# Patient Record
Sex: Female | Born: 1995 | Race: Black or African American | Hispanic: No | Marital: Single | Smoking: Never smoker
Health system: Southern US, Community
[De-identification: ages and names within clinical notes are randomized; demographics above are authoritative.]

## PROBLEM LIST (undated history)

## (undated) DIAGNOSIS — R51 Headache: Secondary | ICD-10-CM

## (undated) DIAGNOSIS — K5900 Constipation, unspecified: Secondary | ICD-10-CM

## (undated) DIAGNOSIS — N200 Calculus of kidney: Secondary | ICD-10-CM

## (undated) DIAGNOSIS — N289 Disorder of kidney and ureter, unspecified: Secondary | ICD-10-CM

## (undated) DIAGNOSIS — K602 Anal fissure, unspecified: Secondary | ICD-10-CM

## (undated) DIAGNOSIS — A749 Chlamydial infection, unspecified: Secondary | ICD-10-CM

## (undated) DIAGNOSIS — N390 Urinary tract infection, site not specified: Secondary | ICD-10-CM

## (undated) HISTORY — DX: Urinary tract infection, site not specified: N39.0

## (undated) HISTORY — DX: Calculus of kidney: N20.0

## (undated) HISTORY — DX: Chlamydial infection, unspecified: A74.9

## (undated) HISTORY — DX: Headache: R51

---
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
## 2017-01-10 ENCOUNTER — Ambulatory Visit (INDEPENDENT_AMBULATORY_CARE_PROVIDER_SITE_OTHER): Payer: Medicaid Other | Admitting: Pediatrics

## 2017-01-10 VITALS — Temp 97.7°F | Wt 133.2 lb

## 2017-01-10 DIAGNOSIS — R300 Dysuria: Secondary | ICD-10-CM | POA: Diagnosis not present

## 2017-01-10 DIAGNOSIS — N898 Other specified noninflammatory disorders of vagina: Secondary | ICD-10-CM

## 2017-01-10 DIAGNOSIS — Z8742 Personal history of other diseases of the female genital tract: Secondary | ICD-10-CM

## 2017-01-10 DIAGNOSIS — Z3202 Encounter for pregnancy test, result negative: Secondary | ICD-10-CM

## 2017-01-10 LAB — POCT URINALYSIS DIPSTICK
BILIRUBIN UA: NEGATIVE
Blood, UA: NEGATIVE
Glucose, UA: NORMAL
KETONES UA: NORMAL
Leukocytes, UA: NEGATIVE
NITRITE UA: NEGATIVE
PH UA: 6 (ref 5.0–8.0)
Spec Grav, UA: 1.025 (ref 1.010–1.025)
UROBILINOGEN UA: 1 U/dL

## 2017-01-10 LAB — POCT URINE PREGNANCY: Preg Test, Ur: NEGATIVE

## 2017-01-10 NOTE — Patient Instructions (Signed)
[facility] features an exceptional balance of expertise and compassion for patients in different life stages. Make an Appointment Call [phone] to make an appointment

## 2017-01-10 NOTE — Progress Notes (Addendum)
Subjective:    Felicia Larsen, is a 21 y.o. female   Chief Complaint  Patient presents with  . Dysuria    2 WEEKS  . Vaginal Discharge    Since 4 months   . Abdominal Cramping    when period comes on    History provider by patient  Contact [phone]   HPI:  CMA's notes and vital signs have been reviewed  New Concern #1 Onset of symptoms:   Dysuria x 2 weeks Urinary frequency No fever No back pain No blood seen in toilet  Hydrates poorly during the day as she works at [employer] and reports little time due to being busy.    Personal hygiene using soap and water regularly to clean.  Very consious about smell  Problem #2 Vaginal discharge on and off over the  Last x 4 months since off depot shot Cottage cheese consistency Vaginal irritation and itchiness Strong odor (not fishy) No recent antibiotics  She is also concerned about BV.  Sexually active in a monogomous relationship. Vaginal penetration only (denies oral/anal route.    Problem #3 Abdominal cramping with menses Bleeding heavy. Previously on Depot shot Currently no contraception, counseled   Medications: None daily Tylenol prn for headaches  Review of Systems  Greater than 10 systems reviewed and all negative except for pertinent positives as noted  Patient's history was reviewed and updated as appropriate: allergies, medications, and problem list.  Patient Active Problem List   Diagnosis Date Noted  . 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 Microscopic hematuria 03/18/2014  . Raynauds syndrome 02/16/2014  . Vaginal discharge 12/20/2013  . Slow transit constipation 10/06/2013  . Sleep disturbance 10/09/2012  . Allergic rhinitis 10/09/2012  . Migraine without aura 07/14/2012       Objective:     Temp 97.7 F (36.5 C) (Temporal)   Wt 133 lb 3.2 oz (60.4 kg)   BMI 21.50 kg/m   Physical Exam  Constitutional: She appears well-developed and well-nourished.    Well appearing  HENT:  Head: Normocephalic.  Nose: Nose normal.  Mouth/Throat: No oropharyngeal exudate.  Eyes: Conjunctivae are normal.  Neck: Normal range of motion. Neck supple.  Cardiovascular: Normal rate, regular rhythm and normal heart sounds.  No murmur heard. Pulmonary/Chest: Effort normal and breath sounds normal. No respiratory distress.  Abdominal: Soft. She exhibits no mass. There is tenderness. There is no guarding.  Denies CVAT  Suprapubic tenderness  Genitourinary: Vaginal discharge found.  Genitourinary Comments: Normal appearing vaginal discharge - milky white No cottage cheese consistency.  No vaginal introitus irritation.  No lesions noted on labia and vaginal introitus  Lymphadenopathy:    She has no cervical adenopathy.  Neurological: She is alert.  Skin: Skin is warm and dry. No rash noted.  Psychiatric: She has a normal mood and affect. Her behavior is normal.  Nursing note and vitals reviewed. Uvula is midline       Assessment & Plan:  1. Dysuria Discussed diagnosis and treatment plan with parent including medication action, dosing and side effects - POCT urinalysis dipstick - negative , unlikely urinary tract infection. Will send for urine culture but low index of suspicion.  Strongly encouraged to hydrate better throughout the day, urinate every 2-3 hours and after intercourse and personal hygiene care to be modified based on discussion today.  2. Vaginal discharge - after discussion with patient and exam today, she declined need for vaginal antifungal treatment as no cottage cheese  type discharge noted.   Discussed lab results with her that were available today and will call with results Contact [phone] when available  - C. trachomatis/N. gonorrhoeae RNA - WET PREP BY MOLECULAR PROBE - POCT urine pregnancy - negative  3. H/O dysmenorrhea She is currently using motrin, heating pad and tea which she reports relieves her pain. Discussed  transitioning care to Adult GYN practice to help patient with ongoing care needs.  Provided information about [facility] team and contact information to help arrange for transition of care.  Medical decision-making:  > 25 minutes spent, more than 50% of appointment was spent discussing diagnosis and management of symptoms  Parent verbalizes understanding and motivation to comply with instructions.  Follow up:  None planned.  Pixie CasinoLaura Stryffeler MSN, CPNP, CDE  Addendum 01/13/17: Review of urine culture - negative and  STI labs - negative Notified patient by phone call. Pixie CasinoLaura Stryffeler MSN, CPNP, CDE

## 2017-01-11 LAB — WET PREP BY MOLECULAR PROBE
CANDIDA SPECIES: NOT DETECTED
MICRO NUMBER:: 81408436
SPECIMEN QUALITY:: ADEQUATE
Trichomonas vaginosis: NOT DETECTED

## 2017-01-11 LAB — C. TRACHOMATIS/N. GONORRHOEAE RNA
C. TRACHOMATIS RNA, TMA: NOT DETECTED
N. gonorrhoeae RNA, TMA: NOT DETECTED

## 2017-01-11 LAB — URINE CULTURE
MICRO NUMBER: 81408437
Result:: NO GROWTH
SPECIMEN QUALITY:: ADEQUATE

## 2017-02-18 ENCOUNTER — Encounter: Payer: Self-pay | Admitting: Pediatrics

## 2017-02-18 ENCOUNTER — Ambulatory Visit (INDEPENDENT_AMBULATORY_CARE_PROVIDER_SITE_OTHER): Payer: Medicaid Other | Admitting: Pediatrics

## 2017-02-18 VITALS — Temp 97.8°F | Wt 129.2 lb

## 2017-02-18 DIAGNOSIS — T148XXA Other injury of unspecified body region, initial encounter: Secondary | ICD-10-CM | POA: Insufficient documentation

## 2017-02-18 DIAGNOSIS — Z113 Encounter for screening for infections with a predominantly sexual mode of transmission: Secondary | ICD-10-CM

## 2017-02-18 DIAGNOSIS — Z3202 Encounter for pregnancy test, result negative: Secondary | ICD-10-CM | POA: Diagnosis not present

## 2017-02-18 DIAGNOSIS — S60452A Superficial foreign body of right middle finger, initial encounter: Secondary | ICD-10-CM

## 2017-02-18 LAB — POCT URINE PREGNANCY: Preg Test, Ur: NEGATIVE

## 2017-02-18 NOTE — Progress Notes (Signed)
Subjective:    Felicia Larsen, is a 22 y.o. female   Chief Complaint  Patient presents with  . finger concern    3 months she said glass is in her finger   History provider by patient  HPI:  CMA's notes and vital signs have been reviewed  New Concern #1 Onset of symptoms:  She believes she could have a piece of glass in finger x 3 months and it hurts when she writes. She is right handed and finds the bump on my right 3rd digit tender and getting frequent irritation from her work with her hands.   No swelling of DIP, no draining.  No Fever. Not taking anything for the pain/discomfort. She works in [employer] at [employer].  New problems #2 Patient [facility] to [facility] and chlamydia and pregnancy. Monogomous partner She is not on contraception except for condoms and they do not use them consistently.   She is trying to get in with her mother's doctor but "I have not found the right person yet and I just love coming here." Voiding  No pain with urination No vaginal discharge "Just want to be safe" Screening completed in December 2018 during office visit for dysuria which was negative for pregnancy, GC , Trichomonas but positive for gardnerella vaginalis.  Medications:  None  Review of Systems  Greater than 10 systems reviewed and all negative except for pertinent positives as noted  Patient's history was reviewed and updated as appropriate: allergies, medications, and problem list.   Patient Active Problem List   Diagnosis Date Noted  . Splinter in skin 02/18/2017  . 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 Microscopic hematuria 03/18/2014  . Raynauds syndrome 02/16/2014  . Vaginal discharge 12/20/2013  . Slow transit constipation 10/06/2013  . Sleep disturbance 10/09/2012  . Allergic rhinitis 10/09/2012  . Migraine without aura 07/14/2012       Objective:     Temp 97.8 F (36.6 C) (Temporal)   Wt 129 lb 3.2 oz (58.6 kg)   BMI  20.85 kg/m   Physical Exam  Constitutional: She appears well-developed.  HENT:  Head: Normocephalic.  Eyes: Conjunctivae are normal.  Cardiovascular: Normal rate, regular rhythm and normal heart sounds.  Musculoskeletal:  Small ~ 2 mm raise fleshtone papule on end of 3rd DIP Painful with pressure.  No evidence of infection.  Neurological: She is alert.  Skin: Skin is warm and dry. No rash noted.  Psychiatric: She has a normal mood and affect. Her behavior is normal.  Nursing note and vitals reviewed.        Assessment & Plan:  1. Screening examination for venereal disease Patient requesting screening to make sure she is "healthy and not pregnant since she is not on birth control and there is not consistent use of condoms.  - C. trachomatis/N. gonorrhoeae RNA - POCT urine pregnancy - negative - discussed result with patient.  Patient to be called with positive results only @ [phone].  2. Splinter in skin Counseled and patient is agreeable to I & D of right 3rd finger with sterile needle. Prepared skin  By cleaning tip of 3rd finger with alcohol x2  and using sterile 20 gauge needle to open skin and used hemostats to remove ?glass splinter.  No bleeding.  Tolerated procedure well.  Patient does not report any pain/tenderness on 3rd DIP. Cleaned and dressed with topical antibiotic. Supportive care and return precautions reviewed.  Again spoke to  patient about identifying and adult physician such as an OB/GYN given need to contraception management for her to see in the future.  Patient reports she is very comfortable at the office and so it have been difficult for her to plan to move her care to and adult office.  Follow up:  None planned.  Pixie Casino MSN, CPNP, CDE

## 2017-02-18 NOTE — Patient Instructions (Signed)
Soak finger in warm water 2 times daily  Monitor for any redness, drainage or signs of infection.  [facility] or OB/GYN doctors, please locate to continue your self care.  Will call if positive labwork. Pixie CasinoLaura Patton Swisher MSN, CPNP, CDE

## 2017-02-19 LAB — C. TRACHOMATIS/N. GONORRHOEAE RNA
C. TRACHOMATIS RNA, TMA: NOT DETECTED
N. gonorrhoeae RNA, TMA: NOT DETECTED

## 2017-03-18 ENCOUNTER — Encounter: Payer: Self-pay | Admitting: Pediatrics

## 2017-03-18 ENCOUNTER — Ambulatory Visit (INDEPENDENT_AMBULATORY_CARE_PROVIDER_SITE_OTHER): Payer: Medicaid Other | Admitting: Pediatrics

## 2017-03-18 ENCOUNTER — Ambulatory Visit
Admission: RE | Admit: 2017-03-18 | Discharge: 2017-03-18 | Disposition: A | Discharge status: Home / Self Care | Payer: Medicaid Other | Source: Ambulatory Visit | Attending: Pediatrics | Admitting: Pediatrics

## 2017-03-18 VITALS — Temp 97.5°F | Wt 125.1 lb

## 2017-03-18 DIAGNOSIS — Z3202 Encounter for pregnancy test, result negative: Secondary | ICD-10-CM | POA: Diagnosis not present

## 2017-03-18 DIAGNOSIS — T148XXA Other injury of unspecified body region, initial encounter: Secondary | ICD-10-CM

## 2017-03-18 DIAGNOSIS — N898 Other specified noninflammatory disorders of vagina: Secondary | ICD-10-CM

## 2017-03-18 LAB — POCT RAPID HIV: RAPID HIV, POC: NEGATIVE

## 2017-03-18 LAB — POCT URINE PREGNANCY: Preg Test, Ur: NEGATIVE

## 2017-03-18 NOTE — Patient Instructions (Signed)
-   Will call with abnormal results

## 2017-03-18 NOTE — Progress Notes (Signed)
   Subjective:     Felicia Larsen N Apgar, is a 22 y.o. female  Personal phone number: [phone]   History provider by patient No interpreter necessary.  Chief Complaint  Patient presents with  . glass in finger    Pt feels it her finger.   . Vaginal Discharge    x2 days, brownish discharge, foul odor.      HPI: Felicia Larsen is a 22 yo F who presents with "glass in her finger" x 4 months and vaginal discharge x 2-3 days.   Patient reports that she still has glass stuck in her finger. It has been there for 4 months now.  Patient was seen on 02/18/17 due to glass in finger (tip of 3rd digit). An I& D was performed and was tolerated well. Patient reports still have pain in the same finger. The pain is intermittent and mostly occurs when she is writing.   Vaginal discharge started 2-3 days go. The discharge is a yellowish color and has a foul odor. Last sexual encounter was 2-3 days ago. She is not on birth control. Her partner did not use condoms.  No fevers. No abdominal pain. No pain with urination.  LMP was last month (pt not sure which day it was)  Review of Systems  As per HPI  Patient's history was reviewed and updated as appropriate: allergies, current medications, past family history, past medical history, past social history, past surgical history and problem list.     Objective:     Temp (!) 97.5 F (36.4 C) (Temporal)   Wt 125 lb 2 oz (56.8 kg)   BMI 20.20 kg/m   Physical Exam GEN: Well-appearing, NAD HEENT:  Sclera clear. Oropharynx non erythematous without lesions or exudates. Moist mucous membranes.  SKIN: No rashes or jaundice.  PULM:  Unlabored respirations.  Clear to auscultation bilaterally with no wheezes or crackles.  No accessory muscle use. CARDIO:  Regular rate and rhythm.  No murmurs.  2+ radial pulses GI:  Soft, non tender, non distended.  GU: Normal external genitalia. No discharge or lesions appreciated.  EXT: Warm and well perfused.  There is a ~ 2  mm flesh colored papule. Non-tender, no erythema. No signs of infection.       Assessment & Plan:   Felicia Larsen is a 22 yo F who presents with "glass in her finger" x 4 months and vaginal discharge x 2-3 days. Given that patient has already had an I&D, will order an X-ray of the finger and, if there is a foreign objected, will refer to adult surgery. Patient his no history of abdominal pain or fever, therefore PID is less likely. Therefore, a pelvic exam was not completed. Her external genital exam was unremarkable with no discharge appreciated. Will order a urine GC/Chlamydia, wet prep, POC HIV, and urine pregnancy test. Will call patient with abnormal results and treatment information if necessary. Patient is trying to find an adult provider to transition to in the community. She reports that mom has been calling around to find a provider.   1. Vaginal discharge - C. trachomatis/N. gonorrhoeae RNA - POCT Rapid HIV - WET PREP BY MOLECULAR PROBE - POCT urine pregnancy  2. Splinter in skin - DG Finger Middle Right; Future   Return if symptoms worsen or fail to improve.  Hollice Gongarshree Carzell Saldivar, MD

## 2017-03-19 LAB — WET PREP BY MOLECULAR PROBE
Candida species: NOT DETECTED
Gardnerella vaginalis: NOT DETECTED
MICRO NUMBER:: 90218413
SPECIMEN QUALITY: ADEQUATE
TRICHOMONAS VAG: NOT DETECTED

## 2017-03-19 LAB — C. TRACHOMATIS/N. GONORRHOEAE RNA
C. trachomatis RNA, TMA: NOT DETECTED
N. gonorrhoeae RNA, TMA: NOT DETECTED

## 2017-03-20 ENCOUNTER — Telehealth: Payer: Self-pay | Admitting: Pediatrics

## 2017-04-23 ENCOUNTER — Encounter: Payer: Self-pay | Admitting: Pediatrics

## 2017-04-23 ENCOUNTER — Ambulatory Visit (INDEPENDENT_AMBULATORY_CARE_PROVIDER_SITE_OTHER): Payer: Medicaid Other | Admitting: Pediatrics

## 2017-04-23 VITALS — BP 108/62 | HR 85 | Ht 65.45 in | Wt 124.2 lb

## 2017-04-23 DIAGNOSIS — Z3202 Encounter for pregnancy test, result negative: Secondary | ICD-10-CM

## 2017-04-23 DIAGNOSIS — Z3049 Encounter for surveillance of other contraceptives: Secondary | ICD-10-CM | POA: Diagnosis not present

## 2017-04-23 DIAGNOSIS — Z113 Encounter for screening for infections with a predominantly sexual mode of transmission: Secondary | ICD-10-CM

## 2017-04-23 DIAGNOSIS — Z30013 Encounter for initial prescription of injectable contraceptive: Secondary | ICD-10-CM | POA: Diagnosis not present

## 2017-04-23 DIAGNOSIS — G43009 Migraine without aura, not intractable, without status migrainosus: Secondary | ICD-10-CM

## 2017-04-23 LAB — POCT URINE PREGNANCY: PREG TEST UR: NEGATIVE

## 2017-04-23 MED ORDER — MEDROXYPROGESTERONE ACETATE 150 MG/ML IM SUSP
150.0000 mg | Freq: Once | INTRAMUSCULAR | Status: AC
  Administered 2017-04-23: 150 mg via INTRAMUSCULAR

## 2017-04-23 NOTE — Patient Instructions (Signed)
Contraceptive Injection Information A contraceptive injection is a shot that prevents pregnancy. It is effective for 3 months. How does the injection work? For this injection, the medicine progestin is injected into your body. The medicine is injected under the skin or in the muscle, usually in the upper arm or the buttock. Progestin has similar effects to the hormone progesterone, which is involved with the menstrual cycle and pregnancy. The progestin injection prevents pregnancy by:  Stopping the ovaries from releasing eggs.  Thickening cervical mucus to prevent sperm from entering the cervix.  Thinning the lining of the uterus to prevent a fertilized egg from attaching to the uterus.  What are the advantages of this form of birth control? The following are some advantages of this form of birth control:  It is highly effective at preventing pregnancy when used correctly.  It can slow down the flow of heavy menstrual periods.  It can control cramps and painful menstrual periods.  It may temporarily stop your menstrual periods (amenorrhea).  It lowers your risk for developing cancer of the uterus and pelvic inflammatory disease (PID).  What are the disadvantages of this form of birth control? The following are some disadvantages of this form of birth control:  It can be associated with side effects such as: ? Weight gain. ? Spotting or bleeding between periods. ? Breast tenderness. ? Headaches. ? Abdominal discomfort. ? Nervousness. ? Loss of bone density.  It does not protect against STIs (sexually transmitted infections).  You must visit your health care provider every 3 months (12 weeks) to receive the injection.  The injections may be uncomfortable.  It can take you up to a year to become pregnant after you stop the injections.  Am I a good candidate for these injections? Your health care provider can help you determine whether you are a good candidate for  contraceptive injections. Make sure to discuss the possible side effects with your health care provider. You should not use this contraceptive if you have a history of:  Breast cancer.  High blood pressure.  Heart attack or stroke.  Liver, heart, or kidney disease.  Liver cancer.  Diabetes.  Unexplained vaginal bleeding.  Rheumatoid arthritis.  Migraines.  Summary  A contraceptive injection is a shot that prevents pregnancy. It is effective for 3 months.  This injection prevents pregnancy by thickening the cervical mucus, thinning the uterine wall, and stopping the ovaries from releasing eggs.  This type of birth control is highly effective at preventing pregnancy. It can also slow down the flow of periods or stop them temporarily.  Side effects of this injection can include weight gain, headaches, bleeding between periods, nervousness, and loss of bone density.  Your health care provider can help you determine whether you are a good candidate for contraceptive injections. This information is not intended to replace advice given to you by your health care provider. Make sure you discuss any questions you have with your health care provider. Document Released: 01/03/2011 Document Revised: 12/04/2015 Document Reviewed: 12/04/2015 Elsevier Interactive Patient Education  2017 Elsevier Inc.  

## 2017-04-23 NOTE — Progress Notes (Signed)
History was provided by the patient.  Felicia Larsen is a 22 y.o. female who is here for contraception.   PCP confirmed? Yes.    Felicia Larsen, Angela J, MD  HPI:  Wants to get back on birth control. Was trying to give her body some time off of it.  Thinks she wants to try pills. Has never been on.  When she has her periods she has some blood clotting but it has improved.  LMP started 3/21. Has fairly significant cramping.  Has migraine with aura. Stars in vision and sometimes blindness in one eye. Feels like she gets them 2-3 times a week. Not waking with headaches, no vomiting during headaches. These same symptoms have been going on for years. No disturbances to balance or loss of consciousness.  Denies vaginal discharge or pain with sex.  Sexually active last week but used condom.   Review of Systems  Constitutional: Negative for malaise/fatigue.  Eyes: Negative for double vision.  Respiratory: Negative for shortness of breath.   Cardiovascular: Negative for chest pain and palpitations.  Gastrointestinal: Negative for abdominal pain, constipation, diarrhea, nausea and vomiting.  Genitourinary: Negative for dysuria.  Musculoskeletal: Negative for joint pain and myalgias.  Skin: Negative for rash.  Neurological: Positive for headaches. Negative for dizziness.  Endo/Heme/Allergies: Does not bruise/bleed easily.     Patient Active Problem List   Diagnosis Date Noted  . Splinter in skin 02/18/2017  . 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 Microscopic hematuria 03/18/2014  . Raynauds syndrome 02/16/2014  . Vaginal discharge 12/20/2013  . Slow transit constipation 10/06/2013  . Sleep disturbance 10/09/2012  . Allergic rhinitis 10/09/2012  . Migraine without aura 07/14/2012    No current outpatient medications on file prior to visit.   No current facility-administered medications on file prior to visit.     Allergies  Allergen Reactions  . Latex  Itching and Rash    IRRITATION    Physical Exam:    Vitals:   04/23/17 1449  BP: 108/62  Pulse: 85  Weight: 124 lb 3.2 oz (56.3 kg)  Height: 5' 5.45" (1.662 m)    Blood pressure percentiles are not available for patients who are 18 years or older. No LMP recorded.  Physical Exam  Constitutional: She appears well-developed. No distress.  HENT:  Mouth/Throat: Oropharynx is clear and moist.  Neck: No thyromegaly present.  Cardiovascular: Normal rate and regular rhythm.  No murmur heard. Pulmonary/Chest: Breath sounds normal.  Abdominal: Soft. She exhibits no mass. There is no tenderness. There is no guarding.  Musculoskeletal: She exhibits no edema.  Lymphadenopathy:    She has no cervical adenopathy.  Neurological: She is alert.  Skin: Skin is warm. No rash noted.  Psychiatric: She has a normal mood and affect.  Nursing note and vitals reviewed.    Assessment/Plan: 1. Encounter for initial prescription of injectable contraceptive Wishes to get back on depo vs one of the other longer acting methods. Plans nexplanon if she is not happy with depo. Discussed risks with estrogen given migraine with aura and she expressed understanding.  - medroxyPROGESTERone (DEPO-PROVERA) injection 150 mg  2. Migraine without aura and without status migrainosus, not intractable Getting frequent migraines- referred to neuro.  - Ambulatory referral to Neurology  3. Routine screening for STI (sexually transmitted infection) Per protocol.  - C. trachomatis/N. gonorrhoeae RNA  4. Pregnancy examination or test, negative result Per protocol.  - POCT urine pregnancy

## 2017-04-24 LAB — C. TRACHOMATIS/N. GONORRHOEAE RNA
C. trachomatis RNA, TMA: NOT DETECTED
N. gonorrhoeae RNA, TMA: NOT DETECTED

## 2017-05-01 ENCOUNTER — Encounter: Payer: Self-pay | Admitting: Pediatrics

## 2017-05-01 ENCOUNTER — Ambulatory Visit (INDEPENDENT_AMBULATORY_CARE_PROVIDER_SITE_OTHER): Payer: Medicaid Other | Admitting: Pediatrics

## 2017-05-01 VITALS — BP 98/60 | Wt 126.2 lb

## 2017-05-01 DIAGNOSIS — R350 Frequency of micturition: Secondary | ICD-10-CM

## 2017-05-01 DIAGNOSIS — L732 Hidradenitis suppurativa: Secondary | ICD-10-CM

## 2017-05-01 LAB — POCT URINALYSIS DIPSTICK
GLUCOSE UA: NORMAL
LEUKOCYTES UA: NEGATIVE
Nitrite, UA: NEGATIVE
RBC UA: NEGATIVE
Spec Grav, UA: 1.025 (ref 1.010–1.025)
Urobilinogen, UA: NEGATIVE E.U./dL — AB
pH, UA: 6 (ref 5.0–8.0)

## 2017-05-01 MED ORDER — CLINDAMYCIN PHOSPHATE 1 % EX GEL: Freq: Every day | CUTANEOUS | 1 refills | Status: DC

## 2017-05-01 NOTE — Patient Instructions (Signed)
Please call if you have any problem getting, or using the medicine(s) prescribed today. Use the medicine as we talked about and as the label directs.  Please be sure to use protection until your next appointment.   Call if you have any genital symptoms before your next appointment.

## 2017-05-01 NOTE — Progress Notes (Signed)
    Assessment and Plan:     1. Urinary frequency No sign of infection.  Will not send culture - POCT urinalysis dipstick  2. Hidradenitis axillaris - clindamycin (CLINDAGEL) 1 % gel; Apply topically daily. Apply small amount to clean dry skin.  Dispense: 30 g; Refill: 1  Other concerns due to unprotected sex cannot be addressed properly for a couple more week.   More than 25 minutes reviewing symptoms, risks, possible diagnoses, preventive measures, and counsel on safe sex.   Intense concern about cost of meds.  Needs return appt.   Return in about 3 weeks (around 05/22/2017) for medication response follow up with Dr Lubertha SouthProse.    Subjective:  HPI Felicia Larsen is a 22 y.o. old female here with herself  Chief Complaint  Patient presents with  . Recurrent Skin Infections    under right arm draining.   Worst episode ever Had a few other spots previously but they resolved quickly Using some alcohol wipes to keep cleaning, as pus keeps draining  Got Depo shot last Thursday Had unprotected sex the next day.  Long time partner. Previously on Depo and had no periods.    Associated signs/symptoms: no Medications/treatments tried at home: above  Fever: nono Change in appetite: no Change in sleep: no Change in breathing: no Vomiting/diarrhea/stool change: no Change in urine: yes, very frequent urination.  No urgency.  No change in color. No burning Change in skin: yes  Immunizations, problem list, medications and allergies were reviewed and updated.   Review of Systems Above   History and Problem List: Felicia Larsen has Migraine without aura; Sleep disturbance; Allergic rhinitis; Slow transit constipation; Vaginal discharge; Raynauds syndrome; Microscopic hematuria; Urethral prolapse; Latex allergy; and Splinter in skin on their problem list.  Felicia Larsen  has a past medical history of Anal fissure, Chlamydia (12/22/2013), Constipation, Headache(784.0), Renal disorder, and Urinary tract  infection.  Objective:   BP 98/60   Wt 126 lb 3.2 oz (57.2 kg)   BMI 20.71 kg/m  Physical Exam  Constitutional: She is oriented to person, place, and time. She appears well-developed and well-nourished.  HENT:  Right Ear: External ear normal.  Left Ear: External ear normal.  Nose: Nose normal.  Mouth/Throat: Oropharynx is clear and moist.  Eyes: Conjunctivae and EOM are normal.  Neck: Neck supple. No thyromegaly present.  Cardiovascular: Normal rate, regular rhythm and normal heart sounds.  Pulmonary/Chest: Effort normal and breath sounds normal.  Abdominal: Soft. Bowel sounds are normal. There is no tenderness.  Neurological: She is alert and oriented to person, place, and time.  Skin: Skin is warm and dry. No rash noted.  Right axilla - 2 cm firm nodule; small amount of pus expressed; very tender   Nursing note and vitals reviewed.   Tilman Neatlaudia C Cameo Schmiesing MD MPH 05/01/2017 4:45 PM

## 2017-05-22 ENCOUNTER — Ambulatory Visit: Payer: Medicaid Other | Admitting: Pediatrics

## 2017-05-27 ENCOUNTER — Encounter (HOSPITAL_COMMUNITY): Payer: Self-pay | Admitting: Emergency Medicine

## 2017-05-27 ENCOUNTER — Emergency Department (HOSPITAL_COMMUNITY)
Admission: EM | Admit: 2017-05-27 | Discharge: 2017-05-27 | Disposition: A | Discharge status: Home / Self Care | Payer: Medicaid Other | Attending: Emergency Medicine | Admitting: Emergency Medicine

## 2017-05-27 DIAGNOSIS — R600 Localized edema: Secondary | ICD-10-CM | POA: Insufficient documentation

## 2017-05-27 DIAGNOSIS — Z5321 Procedure and treatment not carried out due to patient leaving prior to being seen by health care provider: Secondary | ICD-10-CM | POA: Diagnosis not present

## 2017-05-27 NOTE — ED Triage Notes (Signed)
Patient c/o wart to right middle finger x8 months. Patient also c/o abscess to right axilla x 2 months. Reports it was previously drained at PCP.

## 2017-05-27 NOTE — ED Notes (Signed)
Patient called to be placed in treatment room x3 with no answer.

## 2017-05-27 NOTE — ED Notes (Signed)
Pt not answering from lobby 

## 2017-05-27 NOTE — ED Notes (Signed)
No answer from lobby  

## 2017-06-12 ENCOUNTER — Ambulatory Visit: Payer: Medicaid Other | Admitting: Neurology

## 2017-06-12 ENCOUNTER — Encounter: Payer: Self-pay | Admitting: Neurology

## 2017-06-12 ENCOUNTER — Telehealth: Payer: Self-pay | Admitting: Neurology

## 2017-06-12 ENCOUNTER — Other Ambulatory Visit: Payer: Self-pay

## 2017-06-12 VITALS — BP 111/72 | HR 69 | Ht 66.0 in | Wt 126.5 lb

## 2017-06-12 DIAGNOSIS — G43009 Migraine without aura, not intractable, without status migrainosus: Secondary | ICD-10-CM

## 2017-06-12 DIAGNOSIS — N289 Disorder of kidney and ureter, unspecified: Secondary | ICD-10-CM | POA: Diagnosis not present

## 2017-06-12 MED ORDER — FOLIC ACID 1 MG PO TABS: 1.0000 mg | ORAL_TABLET | Freq: Every day | ORAL | 3 refills | Status: DC

## 2017-06-12 MED ORDER — RIZATRIPTAN BENZOATE 10 MG PO TBDP: 10.0000 mg | ORAL_TABLET | Freq: Three times a day (TID) | ORAL | 3 refills | Status: DC | PRN

## 2017-06-12 MED ORDER — TOPIRAMATE 25 MG PO TABS: ORAL_TABLET | ORAL | 3 refills | Status: DC

## 2017-06-12 NOTE — Patient Instructions (Signed)
   We will start Topamax for the headache to take daily to prevent the headache.  Take Maxalt if needed for the headache.  Folic acid 1 mg tablet to take while on Topamax, this is a vitamin.  Topamax (topiramate) is a seizure medication that has an FDA approval for seizures and for migraine headache. Potential side effects of this medication include weight loss, cognitive slowing, tingling in the fingers and toes, and carbonated drinks will taste bad. If any significant side effects are noted on this drug, please contact our office.

## 2017-06-12 NOTE — Progress Notes (Signed)
Reason for visit: Migraine headache  Referring Bonnita Nasutingelia N Amirault is a 22 y.o. female  History of present illness:  Ms. Fackler is a 22 year old right-handed black female with a history of migraine headaches since she was around 22 years old.  The headaches were very severe at that time, and have actually lessened in frequency and severity more recently.  The patient however is still having 5 or 6 headaches a month, she is missing work at times, she missed 3 days last month.  The patient indicates that the headaches are often times around the eyes, when the headaches are severe they may be unilateral on the frontal regions associated with photophobia, phonophobia, and some nausea.  The patient may have sparkly lights in the vision prior to onset of the headache.  She denies any numbness or weakness of the extremities.  She denies confusion with the headache.  She has been taking Advil if needed which seems to be effective.  She indicates that odors such as perfumes, bright lights, and spicy foods will bring on headache.  If she does not get enough sleep this will also cause problems.  The patient indicates that a paternal grandfather also has migraine.  The patient comes into the office today for an evaluation.  She had a CT scan of the brain done in 2007 because of headache which was unremarkable.  Past Medical History:  Diagnosis Date  . Anal fissure   . Chlamydia 12/22/2013  . Constipation   . Headache(784.0)   . Renal calculi   . Renal disorder   . Urinary tract infection    E.Coli resistant to Bactrim    History reviewed. No pertinent surgical history.  Family History  Problem Relation Age of Onset  . Kidney Stones Mother   . GI problems Mother   . Hypertension Mother   . Migraines Maternal Grandmother   . Heart disease Maternal Grandmother   . Stroke Maternal Grandmother   . Asthma Sister   . Asthma Brother   . Diabetes Paternal Grandmother   .  Migraines Paternal Grandfather   . Asthma Brother   . Allergic rhinitis Brother   . Migraines Maternal Aunt   . Migraines Maternal Uncle     Social history:  reports that she has never smoked. She has never used smokeless tobacco. She reports that she does not drink alcohol or use drugs.  Medications:  Prior to Admission medications   Medication Sig Start Date End Date Taking? Authorizing Provider  acetaminophen (TYLENOL) 325 MG tablet Take 650 mg by mouth every 6 (six) hours as needed.   Yes [provider]  ibuprofen (ADVIL,MOTRIN) 200 MG tablet Take 200 mg by mouth every 6 (six) hours as needed.   Yes [provider]  folic acid (FOLVITE) 1 MG tablet Take 1 tablet (1 mg total) by mouth daily. 06/12/17   York Spaniel, MD  rizatriptan (MAXALT-MLT) 10 MG disintegrating tablet Take 1 tablet (10 mg total) by mouth 3 (three) times daily as needed for migraine. May repeat in 2 hours if needed 06/12/17   York Spaniel, MD  topiramate (TOPAMAX) 25 MG tablet Take one tablet at night for one week, then take 2 tablets at night 06/12/17   York Spaniel, MD      Allergies  Allergen Reactions  . Latex Itching and Rash    IRRITATION    ROS:  Out of a complete 14 system review of symptoms,  the patient complains only of the following symptoms, and all other reviewed systems are negative.  Blurred vision, double vision, eye pain Constipation Headache, dizziness Not enough sleep allergies   Blood pressure 111/72, pulse 69, height  (1.676 m), weight 126 lb 8 oz (57.4 kg).  Physical Exam  General: The patient is alert and cooperative at the time of the examination.  Eyes: Pupils are equal, round, and reactive to light. Discs are flat bilaterally.  Neck: The neck is supple, no carotid bruits are noted.  Respiratory: The respiratory examination is clear.  Cardiovascular: The cardiovascular examination reveals a regular rate and rhythm, no obvious murmurs or  rubs are noted.  Skin: Extremities are without significant edema.  Neurologic Exam  Mental status: The patient is alert and oriented x 3 at the time of the examination. The patient has apparent normal recent and remote memory, with an apparently normal attention span and concentration ability.  Cranial nerves: Facial symmetry is present. There is good sensation of the face to pinprick and soft touch bilaterally. The strength of the facial muscles and the muscles to head turning and shoulder shrug are normal bilaterally. Speech is well enunciated, no aphasia or dysarthria is noted. Extraocular movements are full. Visual fields are full. The tongue is midline, and the patient has symmetric elevation of the soft palate. No obvious hearing deficits are noted.  Motor: The motor testing reveals 5 over 5 strength of all 4 extremities. Good symmetric motor tone is noted throughout.  Sensory: Sensory testing is intact to pinprick, soft touch, vibration sensation, and position sense on all 4 extremities. No evidence of extinction is noted.  Coordination: Cerebellar testing reveals good finger-nose-finger and heel-to-shin bilaterally.  Gait and station: Gait is normal. Tandem gait is normal. Romberg is negative. No drift is seen.  Reflexes: Deep tendon reflexes are symmetric and normal bilaterally. Toes are downgoing bilaterally.   Assessment/Plan:  1.  Classic migraine headache  The patient has intractable headache, she is having headache frequency enough that it would justify daily prophylactic medication.  The patient will be started on Topamax working up to 50 mg at night, she will be given Maxalt to take if needed.  She will call for any dose adjustments.  She will follow-up in 3 months.  The patient was also given a prescription for folic acid taking 1 mg daily.  Marlan Palau MD 06/12/2017 11:58 AM  [facility] [address] [place], [place]  [address]  Phone [phone] Fax [phone]

## 2017-06-12 NOTE — Telephone Encounter (Signed)
[facility] [phone] needs clarification on script sent for rizatriptan (MAXALT-MLT) 10 MG disintegrating tablet. On the directions. Thank you

## 2017-06-12 NOTE — Telephone Encounter (Signed)
I tried to call pharmacy, unable to get through, I resubmitted the prescription for Maxalt, made the instructions more clear.

## 2017-07-09 ENCOUNTER — Ambulatory Visit: Payer: Medicaid Other

## 2017-07-11 ENCOUNTER — Other Ambulatory Visit: Payer: Self-pay

## 2017-07-11 ENCOUNTER — Ambulatory Visit (INDEPENDENT_AMBULATORY_CARE_PROVIDER_SITE_OTHER): Payer: Medicaid Other | Admitting: Pediatrics

## 2017-07-11 ENCOUNTER — Encounter: Payer: Self-pay | Admitting: Pediatrics

## 2017-07-11 VITALS — Wt 127.6 lb

## 2017-07-11 DIAGNOSIS — L732 Hidradenitis suppurativa: Secondary | ICD-10-CM | POA: Diagnosis not present

## 2017-07-11 DIAGNOSIS — B079 Viral wart, unspecified: Secondary | ICD-10-CM | POA: Diagnosis not present

## 2017-07-11 MED ORDER — SALICYLIC ACID 17 % EX GEL: CUTANEOUS | 0 refills | Status: DC

## 2017-07-11 NOTE — Patient Instructions (Addendum)
   Apply to wart on finger and cover with duct tape; change every 2 days. May take up to 3 months to resolve. If not effective please let us know.  For the lesion at your underarm area: Warm showers and application of warm compress when able. Avoid tight fitting garments that may rub at area and forgo shaving for now. Choose a mild deordorant like Dove or plain Secret  Please check with your mother and decide your preference for adult medical services. [facility] is often a good choice for our patients moving out of pediatric medical care into adult care. We can continue to see you for medical care until you decide on adult practice.

## 2017-07-11 NOTE — Progress Notes (Signed)
   Subjective:    Patient ID: Felicia Larsen, female    DOB: 11/14/1995, 22 y.o.   MRN: [id number]  HPI Felicia Larsen is here with concern of wart on finger for about 6 months and continued lesion under right arm. She Larsen she purchased OTC product for the wart and used for about one month.  Admits it got smaller with treatment but product in bottle dried up and she decided to come to office for advice; also Larsen she may have used incorrectly, applied on and around lesion, resulting in skin irritation.   Has some discomfort due to wart.  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   Only one digit involved.  No other modifying factors.  Other concern is lesion in right axilla.  She was seen in office 2 months ago for this and Larsen provider drained lesion. Felicia Larsen she feels this made it worse and describes developing a cord in her upper arm.  She was prescribed clindamycin gel but Larsen she did not get it because her insurance did not cover it and she could not afford the medication.  She uses Darene LamerNair for hair removal and uses EchoStarDove deodorant. Has pain but no redness, increased warmth or drainage; no fever.  Cord is resolved. No other medication or modifying factors.  She Larsen she is otherwise well. PMH, problem list, medications and allergies, family and social history reviewed and updated as indicated. Personal phone:  [phone]; ca;; before noon due to inability to accept calls while at work.  Review of Systems As noted above.    Objective:   Physical Exam  Constitutional: She appears well-developed and well-nourished. No distress.  Cardiovascular: Normal rate, regular rhythm and normal heart sounds.  No murmur heard. Pulmonary/Chest: Effort normal and breath sounds normal. No respiratory distress.  Skin: Skin is warm and dry.  Right 3rd finger has a 1-2 mm hyperpigment wart that is mildly raised.  No surrounding redness or other abnormal finding, Right  axilla has an approximate 8 mm induration without fluctuance, warmth or redness. No palpable extension.  Patient voices discomfort on manipulation.  Nursing note and vitals reviewed.  Weight 127 lb 9.6 oz (57.9 kg).    Assessment & Plan:   1. Viral wart on finger Lesion is tiny and she admits improvement with OTC.  Discussed wart treatment with patient and relative discomfort of treatments. She is willing to try advised treatment from MD of topical SA gel (cost is about $4 per website representation) and duct tape.  She will follow up if not improved or if complications. - salicylic acid 17 % gel; Clean and dry skin then apply gel to wart; cover with duct tape.  Reapply every 48 hours.  May take up to 12 weeks to resolve.  Dispense: 15 g; Refill: 0  2. Hidradenitis axillaris Current finding suggests no residual to drain and area is both highly localized and small.  Also, comparison to description in chart 2 months ago shows much improvement. Discussed personal care to avoid irritation and decrease return rate. Her current insurance plan does not cover antibiotic gel.  Searched GoodRx for patient and discussed best cost of 31.48 and she stated she can afford this price.  Will follow up as needed.  Maree ErieAngela J Erienne Spelman, MD

## 2017-07-12 ENCOUNTER — Encounter: Payer: Self-pay | Admitting: Pediatrics

## 2017-07-12 MED ORDER — CLINDAMYCIN PHOSPHATE 1 % EX GEL: CUTANEOUS | 0 refills | Status: DC

## 2017-07-14 DIAGNOSIS — J020 Streptococcal pharyngitis: Secondary | ICD-10-CM | POA: Diagnosis not present

## 2017-07-29 ENCOUNTER — Ambulatory Visit: Payer: Medicaid Other | Admitting: Student

## 2017-08-07 ENCOUNTER — Ambulatory Visit (INDEPENDENT_AMBULATORY_CARE_PROVIDER_SITE_OTHER): Payer: Medicaid Other | Admitting: Licensed Clinical Social Worker

## 2017-08-07 ENCOUNTER — Encounter: Payer: Self-pay | Admitting: Pediatrics

## 2017-08-07 ENCOUNTER — Ambulatory Visit (INDEPENDENT_AMBULATORY_CARE_PROVIDER_SITE_OTHER): Payer: Medicaid Other | Admitting: Pediatrics

## 2017-08-07 VITALS — Wt 130.1 lb

## 2017-08-07 DIAGNOSIS — F4320 Adjustment disorder, unspecified: Secondary | ICD-10-CM | POA: Diagnosis not present

## 2017-08-07 DIAGNOSIS — N898 Other specified noninflammatory disorders of vagina: Secondary | ICD-10-CM

## 2017-08-07 DIAGNOSIS — L732 Hidradenitis suppurativa: Secondary | ICD-10-CM | POA: Diagnosis not present

## 2017-08-07 DIAGNOSIS — Z7251 High risk heterosexual behavior: Secondary | ICD-10-CM | POA: Diagnosis not present

## 2017-08-07 MED ORDER — ULIPRISTAL ACETATE 30 MG PO TABS
30.0000 mg | ORAL_TABLET | Freq: Once | ORAL | Status: AC
  Administered 2017-08-07: 30 mg via ORAL

## 2017-08-07 NOTE — Progress Notes (Signed)
   Subjective:    Patient ID: Felicia Larsen, female    DOB: May 10, 1995, 22 y.o.   MRN: [id number]  HPI Felicia Larsen is here with multiple concerns. 1.  States interest in "plan b" pill due to unprotected intercourse.  States current female partner of 4 months.  No use of contraception and no use of condom. States last intercourse yesterday and 3 days ago; other prior encounters.  States she was having breast discomfort and vaginal discharge but now has menstruation for Day #3.  Informed staff that cost OTC of plan B was prohibitive, so consulting office for advice. Had hormonal implant but had it removed, then started depo hormonal injection but failed to follow through; last injection was in March.  2.  Complains of recurrent discomfort in axilla.  She has presented to the office for this on 2 previous occassions and was each time prescribed topical clindamycin.  States she never picked up the medication.  Reports pain gets better and worse and has had "cord" palpable in arm before.  Currently using mild deodorant product.  3.  Viral wart:  States nearly resolved with use of salicylic acid and duct tape; no further needs today.  No other medications or modifying factors.  PMH, problem list, medications and allergies, family and social history reviewed and updated as indicated.  Review of Systems As noted in HPI.    Objective:   Physical Exam  Constitutional: She appears well-developed and well-nourished. No distress.  HENT:  Head: Normocephalic.  Neck: Normal range of motion. Neck supple.  Cardiovascular: Normal rate and normal heart sounds.  Pulmonary/Chest: Effort normal. No respiratory distress.  Skin: Skin is warm and dry.  Right axilla with palpable puffy area of about 1 inch but no discrete papule or fluctuance; no redness or drainage; no extension to upper arm  Nursing note and vitals reviewed.   Weight 130 lb 1.6 oz (59 kg).    Assessment & Plan:   1. Hidradenitis  axillaris Nothing to drain today and does not feel warm or fluctuant. Discussed need to follow previous care directions and use the topical clindamycin, antiperspirant free underarm care.  Prescription should still be active at pharmacy, or she can have them contact office for resend. Felicia Larsen voiced understanding.  2. Unprotected sex Counseled on need for personal care to avoid STI and undesired pregnancy.  She met with [facility] during this visit. Will contact with test results in MyChart and treat as indicated; she voiced agreement with plan. She is to keep appointment with Adolescent Medicine. - C. trachomatis/N. gonorrheae RNA - WET PREP BY MOLECULAR PROBE - hCG, quantitative, pregnancy - ulipristal acetate (ELLA) tablet 30 mg  3. Vaginal discharge As discussed above; will contact with results and any treatment. - C. trachomatis/N. gonorrheae RNA - WET PREP BY MOLECULAR PROBE  Lurlean Leyden, MD

## 2017-08-07 NOTE — Patient Instructions (Addendum)
For the underarm lesion:  Warm compress to underarm and avoid tight fitting clothing. Use a mild deodorant like we discussed - nothing that is long duration (N degree) and preferably without the antiperspirant factor.  You may want to check the natural products lines like Tom's. Pick up the clindamycin gel as previously prescribed.  Keep your appt with Adolescent Medicine.    Ulipristal oral tablets What is this medicine? ULIPRISTAL (UE li pris tal) is an emergency contraceptive. It prevents pregnancy if taken within 5 days (120 hours) after your birth control fails or you have unprotected sex. This medicine will not work if you are already pregnant. This medicine may be used for other purposes; ask your health care provider or pharmacist if you have questions. COMMON BRAND NAME(S): ella What should I tell my health care provider before I take this medicine? They need to know if you have any of these conditions: -an unusual or allergic reaction to ulipristal, other medicines, foods, dyes, or preservatives -pregnant or trying to get pregnant -breast-feeding How should I use this medicine? Take this medicine by mouth with or without food. Your doctor may want you to use a quick-response pregnancy test prior to using the tablets. Take your medicine as soon as possible and not more than 5 days (120 hours) after the event. This medicine can be taken at any time during your menstrual cycle. Follow the dose instructions of your health care provider exactly. Contact your health care provider right away if you vomit within 3 hours of taking your medicine to discuss if you need to take another tablet. A patient package insert for the product will be given with each prescription and refill. Read this sheet carefully each time. The sheet may change frequently. Contact your pediatrician regarding the use of this medicine in children. Special care may be needed. Overdosage: If you think you have taken too  much of this medicine contact a poison control center or emergency room at once. NOTE: This medicine is only for you. Do not share this medicine with others. What if I miss a dose? This does not apply; this medicine is not for regular use. What may interact with this medicine? This medicine may interact with the following medications: -birth control pills -bosentan -certain medicines for fungal infections like griseofulvin, itraconazole, and ketoconazole -certain medicines for seizures like barbiturates, carbamazepine, felbamate, oxcarbazepine, phenytoin, topiramate -dabigatran -digoxin -rifampin -St. John's Wort This list may not describe all possible interactions. Give your health care provider a list of all the medicines, herbs, non-prescription drugs, or dietary supplements you use. Also tell them if you smoke, drink alcohol, or use illegal drugs. Some items may interact with your medicine. What should I watch for while using this medicine? Your period may begin a few days earlier or later than expected. If your period is more than 7 days late, pregnancy is possible. See your health care provider as soon as you can and get a pregnancy test. Talk to your healthcare provider before taking this medicine if you know or suspect that you are pregnant. Contact your healthcare provider if you think you may be pregnant and you have taken this medicine. Your healthcare provider may wish to provide information on your pregnancy to help study the safety of this medicine during pregnancy. For information, go to AttractionPlanet.fi. If you have severe abdominal pain about 3 to 5 weeks after taking this medicine, you may have a pregnancy outside the womb, which is called an ectopic or  tubal pregnancy. Call your health care provider or go to the nearest emergency room right away if you think this is happening. Discuss birth control options with your health care provider. Emergency birth control is not to be used  routinely to prevent pregnancy. It should not be used more than once in the same cycle. Birth control pills may not work properly while you are taking this medicine. Wait at least 5 days after taking this medicine to start or continue other hormone based birth control. Be sure to use a reliable barrier contraceptive method (such as a condom with spermicide) between the time you take this medicine and your next period. This medicine does not protect you against HIV infection (AIDS) or any other sexually transmitted diseases (STDs). What side effects may I notice from receiving this medicine? Side effects that you should report to your doctor or health care professional as soon as possible: -allergic reactions like skin rash, itching or hives, swelling of the face, lips, or tongue Side effects that usually do not require medical attention (report to your doctor or health care professional if they continue or are bothersome): -dizziness -headache -nausea -spotting -stomach pain -tiredness This list may not describe all possible side effects. Call your doctor for medical advice about side effects. You may report side effects to FDA at 1-800-FDA-1088. Where should I keep my medicine? Keep out of the reach of children. Store at between 20 and 25 degrees C (68 and 77 degrees F). Protect from light and keep in the blister card inside the original box until you are ready to take it. Throw away any unused medicine after the expiration date. NOTE: This sheet is a summary. It may not cover all possible information. If you have questions about this medicine, talk to your doctor, pharmacist, or health care provider.  2018 Elsevier/Gold Standard (2015-02-16 10:39:30)   Adult Primary Care Options:  Call to enroll!  [facility]  [address], [place], [place] [address] [phone]  [facility] [address] [place], [place], [place] [address] [phone]  [phone]

## 2017-08-07 NOTE — BH Specialist Note (Signed)
Integrated Behavioral Health Initial Visit  MRN: [id number] Name: Felicia GoodellAngelia N Zollner  Number of Integrated Behavioral Health Clinician visits:: 1/6 Session Start time: 2:36 PM Session End time: 2:52PM  Total time: 16 Minutes  Type of Service: Integrated Behavioral Health- Individual/Family Interpretor:No. Interpretor Name and Language: N/A   Warm Hand Off Completed.       SUBJECTIVE: Felicia Goodellngelia N Larsen is a 22 y.o. female  Patient was referred by Dr. Duffy RhodyStanley  for support. Patient reports the following symptoms/concerns:  Patient with hx armpit pain and need for emergency contraception.  Duration of problem: Days to weeks; Severity of problem: moderate  OBJECTIVE: Mood: Euthymic and Affect: Appropriate Risk of harm to self or others: No plan to harm self or others  LIFE CONTEXT: Family and Social: Pt lives with  mom and siblings.   School/Work:Pt has an upcoming  Interview at  [facility] [employer] [facility] [employer].  Pt worked at [employer] previously.  Self-Care: Pt enjoys watching  TV and  Hanging out w boyfriend- Alinda Moneyony often.  Life Changes:  Pt report recent acne break out.  Discontinued contraception recently due to side effects. Recent relationship- dating 4 mo.    GOALS ADDRESSED: Patient will: 1. Identify barriers of social emotional development.  2. Demonstrate ability to: Increase adequate support systems for patient/family    INTERVENTIONS: Interventions utilized: Supportive Counseling and Psychoeducation and/or Health Education  Standardized Assessments completed: [facility] with [facility]  ASSESSMENT: Patient currently experiencing difficulty  setting boundaries with boyfriend surrounding risky sexual behavior (Use of contraception) . Pt with interest in re-exploring Nexplanon and discussing ways to combat side effects at upcoming apt w. [employer].    Processed weighing pros and cons and using assertive language.    Patient may benefit from communicating healthy sexual  boundaries with boyfriend.    Patient may benefit from connecting with adult primary care, brief resources provided.   PLAN: 1. Follow up with behavioral health clinician on : As needed.  2. Behavioral recommendations:  1. Pt will discuss healthy sexual boundaries with boyfriend. 3. Referral(s): Community Resources:  Adult Primary Care options 4. "From scale of 1-10, how likely are you to follow plan?": Pt voice understanding and agreement.   Shiniqua Prudencio BurlyP Harris, LCSWA

## 2017-08-08 LAB — C. TRACHOMATIS/N. GONORRHOEAE RNA
C. trachomatis RNA, TMA: NOT DETECTED
N. GONORRHOEAE RNA, TMA: NOT DETECTED

## 2017-08-08 LAB — WET PREP BY MOLECULAR PROBE
Candida species: NOT DETECTED
MICRO NUMBER: 90822941
SPECIMEN QUALITY: ADEQUATE
Trichomonas vaginosis: NOT DETECTED

## 2017-08-08 LAB — HCG, QUANTITATIVE, PREGNANCY: HCG, Total, QN: <2 m[IU]/mL

## 2017-08-13 ENCOUNTER — Ambulatory Visit: Payer: Medicaid Other | Admitting: Family

## 2017-08-19 NOTE — Telephone Encounter (Signed)
Opened in error, please disregard.

## 2017-09-17 ENCOUNTER — Ambulatory Visit (INDEPENDENT_AMBULATORY_CARE_PROVIDER_SITE_OTHER): Payer: Medicaid Other | Admitting: Family

## 2017-09-17 ENCOUNTER — Encounter: Payer: Self-pay | Admitting: Family

## 2017-09-17 VITALS — BP 111/75 | HR 96 | Ht 66.0 in | Wt 132.2 lb

## 2017-09-17 DIAGNOSIS — Z113 Encounter for screening for infections with a predominantly sexual mode of transmission: Secondary | ICD-10-CM | POA: Diagnosis not present

## 2017-09-17 DIAGNOSIS — Z30017 Encounter for initial prescription of implantable subdermal contraceptive: Secondary | ICD-10-CM

## 2017-09-17 DIAGNOSIS — Z3202 Encounter for pregnancy test, result negative: Secondary | ICD-10-CM

## 2017-09-17 LAB — POCT URINE PREGNANCY: Preg Test, Ur: NEGATIVE

## 2017-09-17 MED ORDER — ETONOGESTREL 68 MG ~~LOC~~ IMPL
68.0000 mg | DRUG_IMPLANT | Freq: Once | SUBCUTANEOUS | Status: AC
  Administered 2017-09-17: 68 mg via SUBCUTANEOUS

## 2017-09-17 NOTE — Progress Notes (Signed)
History was provided by the patient.  Felicia Larsen is a 22 y.o. female who is here for nexplanon insertion.   PCP confirmed? Yes.    Felicia Larsen, Felicia J, MD  HPI:   -Returns for nexplanon insertion -Latex sensitive, requests non-latex condoms -is not interested in IUD at this time  Review of Systems  Constitutional: Negative for malaise/fatigue.  Eyes: Negative for double vision.  Respiratory: Negative for shortness of breath.   Cardiovascular: Negative for chest pain and palpitations.  Gastrointestinal: Negative for abdominal pain, constipation, diarrhea, nausea and vomiting.  Genitourinary: Negative for dysuria.  Musculoskeletal: Negative for joint pain and myalgias.  Skin: Negative for rash.  Neurological: Negative for dizziness and headaches.  Endo/Heme/Allergies: Does not bruise/bleed easily.     Patient Active Problem List   Diagnosis Date Noted  . Renal disorder   . Splinter in skin 02/18/2017  . 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 Microscopic hematuria 03/18/2014  . Raynauds syndrome 02/16/2014  . Vaginal discharge 12/20/2013  . Slow transit constipation 10/06/2013  . Sleep disturbance 10/09/2012  . Allergic rhinitis 10/09/2012  . Migraine without aura 07/14/2012    Current Outpatient Medications on File Prior to Visit  Medication Sig Dispense Refill  . ibuprofen (ADVIL,MOTRIN) 200 MG tablet Take 200 mg by mouth every 6 (six) hours as needed.    Marland Kitchen. acetaminophen (TYLENOL) 325 MG tablet Take 650 mg by mouth every 6 (six) hours as needed.    . clindamycin (CLINDAGEL) 1 % gel Apply to clean skin at lesion in underarm area twice a day until resolved. (Patient not taking: Reported on 08/07/2017) 30 g 0  . folic acid (FOLVITE) 1 MG tablet Take 1 tablet (1 mg total) by mouth daily. (Patient not taking: Reported on 07/11/2017) 90 tablet 3  . rizatriptan (MAXALT-MLT) 10 MG disintegrating tablet Take 1 tablet (10 mg total) by mouth 3 (three)  times daily as needed for migraine. (Patient not taking: Reported on 07/11/2017) 9 tablet 3  . salicylic acid 17 % gel Clean and dry skin then apply gel to wart; cover with duct tape.  Reapply every 48 hours.  May take up to 12 weeks to resolve. (Patient not taking: Reported on 08/07/2017) 15 g 0  . topiramate (TOPAMAX) 25 MG tablet Take one tablet at night for one week, then take 2 tablets at night (Patient not taking: Reported on 07/11/2017) 60 tablet 3   No current facility-administered medications on file prior to visit.     Allergies  Allergen Reactions  . Latex Itching and Rash    IRRITATION    Physical Exam:    Vitals:   09/17/17 1345  BP: 111/75  Pulse: 96  Weight: 132 lb 3.2 oz (60 kg)  Height: 5\' 6"  (1.676 m)    Growth percentile SmartLinks can only be used for patients less than 20 years old. No LMP recorded.  Physical Exam  Constitutional: She is oriented to person, place, and time. She appears well-developed and well-nourished. No distress.  Eyes: Pupils are equal, round, and reactive to light. EOM are normal. No scleral icterus.  Neck: Normal range of motion. Neck supple. No thyromegaly present.  Cardiovascular: Normal rate, regular rhythm, normal heart sounds and intact distal pulses.  No murmur heard. Pulmonary/Chest: Effort normal and breath sounds normal.  Abdominal: Soft. There is no tenderness. There is no guarding.  Musculoskeletal: Normal range of motion. She exhibits no edema or tenderness.  Lymphadenopathy:    She has  no cervical adenopathy.  Neurological: She is alert and oriented to person, place, and time. No cranial nerve deficit.  Skin: Skin is warm and dry. No rash noted.  Psychiatric: She has a normal mood and affect.  Nursing note and vitals reviewed.   Assessment/Plan:   1. Nexplanon insertion -see procedure note.  - Subdermal Etonogestrel Implant Insertion  LMP was reviewed, as well as cycle history.  Sexual history was discussed,  including current contraception.  Patient is currently sexually active.  Risks and benefits of First and Second Tier contraceptive options were discussed. Patient verbalized understanding of available contraception choices and desired Nexplanon placement.   Patient's other goals for contraception include no estrogen (contraindication), long-term method.   Detailed discussion about the unpredictable vaginal bleeding associated with Nexplanon within the first 30 days through the first 6 months of product use was discussed.  Patient verbalized understanding of bleeding and was also educated on signs of worrisome or heavy bleeding that would warrant further follow-up.  Patient was also advised to use back-up contraception for the next 7 days.  Condoms were provided to patient and STI protection was addressed.  Patient had no further questions and procedure was completed per procedure note.   1. Routine screening for STI (sexually transmitted infection) -negative - C. trachomatis/N. gonorrhoeae RNA  2. Pregnancy examination or test, negative result -negative - POCT urine pregnancy

## 2017-09-18 LAB — C. TRACHOMATIS/N. GONORRHOEAE RNA
C. trachomatis RNA, TMA: NOT DETECTED
N. gonorrhoeae RNA, TMA: NOT DETECTED

## 2017-09-19 ENCOUNTER — Encounter: Payer: Self-pay | Admitting: Family

## 2017-09-19 NOTE — Procedures (Signed)
Nexplanon Insertion  No contraindications for placement.  No liver disease, no unexplained vaginal bleeding, no h/o breast cancer, no h/o blood clots.  No LMP recorded.  UHCG: negative  Last Unprotected sex:  NA  Risks & benefits of Nexplanon discussed The nexplanon device was purchased and supplied by [facility]. Packaging instructions supplied to patient Consent form signed  The patient denies any allergies to anesthetics or antiseptics.  Procedure: Pt was placed in supine position. The left arm was flexed at the elbow and externally rotated so that her wrist was parallel to her ear The medial epicondyle of the left arm was identified The insertions site was marked 8 cm proximal to the medial epicondyle The insertion site was cleaned with Betadine The area surrounding the insertion site was covered with a sterile drape 1% lidocaine was injected just under the skin at the insertion site extending 4 cm proximally. The sterile preloaded disposable Nexaplanon applicator was removed from the sterile packaging The applicator needle was inserted at a 30 degree angle at 8 cm proximal to the medial epicondyle as marked The applicator was lowered to a horizontal position and advanced just under the skin for the full length of the needle The slider on the applicator was retracted fully while the applicator remained in the same position, then the applicator was removed. The implant was confirmed via palpation as being in position The implant position was demonstrated to the patient Pressure dressing was applied to the patient.  The patient was instructed to removed the pressure dressing in 24 hrs.  The patient was advised to move slowly from a supine to an upright position  The patient denied any concerns or complaints  The patient was instructed to schedule a follow-up appt in 1 month and to call sooner if any concerns.  The patient acknowledged agreement and understanding of the plan.  

## 2017-10-02 ENCOUNTER — Telehealth: Payer: Self-pay | Admitting: Pediatrics

## 2017-10-02 ENCOUNTER — Other Ambulatory Visit: Payer: Self-pay | Admitting: Family

## 2017-10-02 MED ORDER — PRENATAL VITAMINS 0.8 MG PO TABS: 1.0000 | ORAL_TABLET | Freq: Every day | ORAL | 0 refills | Status: DC

## 2017-10-02 NOTE — Telephone Encounter (Signed)
Patient called and said that [facility] [facility] told her that she would send script for prenatal vitamins but has not done it yet. She wanted to remind her that she needs those vitamins. Please call back when complete.

## 2017-10-02 NOTE — Telephone Encounter (Signed)
Med sent. Called and made patient aware.

## 2017-11-03 ENCOUNTER — Ambulatory Visit (INDEPENDENT_AMBULATORY_CARE_PROVIDER_SITE_OTHER): Payer: Medicaid Other | Admitting: Pediatrics

## 2017-11-03 ENCOUNTER — Encounter: Payer: Self-pay | Admitting: Pediatrics

## 2017-11-03 VITALS — BP 120/66 | HR 77 | Ht 66.0 in | Wt 134.0 lb

## 2017-11-03 DIAGNOSIS — G43109 Migraine with aura, not intractable, without status migrainosus: Secondary | ICD-10-CM | POA: Insufficient documentation

## 2017-11-03 DIAGNOSIS — Z975 Presence of (intrauterine) contraceptive device: Secondary | ICD-10-CM

## 2017-11-03 DIAGNOSIS — F4323 Adjustment disorder with mixed anxiety and depressed mood: Secondary | ICD-10-CM | POA: Diagnosis not present

## 2017-11-03 DIAGNOSIS — L732 Hidradenitis suppurativa: Secondary | ICD-10-CM

## 2017-11-03 DIAGNOSIS — G43119 Migraine with aura, intractable, without status migrainosus: Secondary | ICD-10-CM

## 2017-11-03 MED ORDER — TOPIRAMATE 25 MG PO TABS: ORAL_TABLET | ORAL | 3 refills | Status: DC

## 2017-11-03 NOTE — Patient Instructions (Addendum)
Start taking topamax 25 mg daily at bedtime. After 1 week, increase to 50 mg daily.  Keep monitoring the place under your arm until you have health insurance. At that time we can do some treatment for it.

## 2017-11-03 NOTE — Progress Notes (Signed)
History was provided by the patient.  Felicia Larsen is a 22 y.o. female who is here for mood concerns.  Maree Erie, MD   HPI:  Pt reports that she came back about a month ago to get back on birth control and she was having some mild depression but now it seems to be worsening. She has been in IUD, depo and nexplanon and recalls mood issues previously.   Migraines have been worsening similar to back when she was 22 years old. She states she needs to go back to neurology and somehow doesn't recall that she had a visit there in May.   She sleeps about 2 hours at night. She used to sleep very frequently but she is dozing off during the day.   Goes to bed before 10 and has to get up at 5 am. She is working full time at [employer]. 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 Started job about 4 weeks ago.   She is trying to eat better and do more fun activities with her boyfriend. Not currently exercising. Denies ever having seen for any mood concerns. She has had thoughts about hurting herself but none recently- things like driving her car off the road.   Also with concern about ongoing drainage and odor from lesion under right axilla.   No LMP recorded.  Review of Systems  Constitutional: Negative for malaise/fatigue.  Eyes: Negative for double vision.  Respiratory: Positive for shortness of breath.   Cardiovascular: Negative for chest pain and palpitations.  Gastrointestinal: Negative for abdominal pain, constipation, diarrhea, nausea and vomiting.  Genitourinary: Negative for dysuria.  Musculoskeletal: Negative for joint pain and myalgias.  Skin: Negative for rash.  Neurological: Positive for headaches. Negative for dizziness.  Endo/Heme/Allergies: Does not bruise/bleed easily.  Psychiatric/Behavioral: Positive for depression. The patient is nervous/anxious.     Patient Active Problem List   Diagnosis Date Noted  . Renal disorder   . Splinter in skin 02/18/2017   . 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 Microscopic hematuria 03/18/2014  . Raynauds syndrome 02/16/2014  . Vaginal discharge 12/20/2013  . Slow transit constipation 10/06/2013  . Sleep disturbance 10/09/2012  . Allergic rhinitis 10/09/2012  . Migraine without aura 07/14/2012    Current Outpatient Medications on File Prior to Visit  Medication Sig Dispense Refill  . acetaminophen (TYLENOL) 325 MG tablet Take 650 mg by mouth every 6 (six) hours as needed.    Marland Kitchen ibuprofen (ADVIL,MOTRIN) 200 MG tablet Take 200 mg by mouth every 6 (six) hours as needed.    . clindamycin (CLINDAGEL) 1 % gel Apply to clean skin at lesion in underarm area twice a day until resolved. (Patient not taking: Reported on 08/07/2017) 30 g 0  . folic acid (FOLVITE) 1 MG tablet Take 1 tablet (1 mg total) by mouth daily. (Patient not taking: Reported on 07/11/2017) 90 tablet 3  . Prenatal Multivit-Min-Fe-FA (PRENATAL VITAMINS) 0.8 MG tablet Take 1 tablet by mouth daily. (Patient not taking: Reported on 11/03/2017) 90 tablet 0  . rizatriptan (MAXALT-MLT) 10 MG disintegrating tablet Take 1 tablet (10 mg total) by mouth 3 (three) times daily as needed for migraine. (Patient not taking: Reported on 07/11/2017) 9 tablet 3  . salicylic acid 17 % gel Clean and dry skin then apply gel to wart; cover with duct tape.  Reapply every 48 hours.  May take up to 12 weeks to resolve. (Patient not taking: Reported on 08/07/2017) 15  g 0  . topiramate (TOPAMAX) 25 MG tablet Take one tablet at night for one week, then take 2 tablets at night (Patient not taking: Reported on 07/11/2017) 60 tablet 3   No current facility-administered medications on file prior to visit.     Allergies  Allergen Reactions  . Latex Itching and Rash    IRRITATION    Physical Exam:    Vitals:   11/03/17 1554  BP: 120/66  Pulse: 77  Weight: 134 lb (60.8 kg)  Height: 5\' 6"  (1.676 m)    Growth percentile SmartLinks can only be used for  patients less than 60 years old.  Physical Exam  Constitutional: She appears well-developed. No distress.  HENT:  Mouth/Throat: Oropharynx is clear and moist.  Neck: No thyromegaly present.  Cardiovascular: Normal rate and regular rhythm.  No murmur heard. Pulmonary/Chest: Breath sounds normal.  Abdominal: Soft. She exhibits no mass. There is no tenderness. There is no guarding.  Musculoskeletal: She exhibits no edema.  Lymphadenopathy:    She has no cervical adenopathy.  Neurological: She is alert.  Skin: Skin is warm. No rash noted.  R axillary pocket consistent with hidradenitis. No redness/swelling. Small amount of non-purulent drainage when manipulated.   Psychiatric: Her mood appears anxious. She expresses impulsivity. She expresses no suicidal ideation. She expresses no suicidal plans. She is inattentive.  Nursing note and vitals reviewed.   Assessment/Plan: 1. Nexplanon in place Presents back with complaints that nexplanon has significantly worsened her mood. Would like to return for removal. She is not interested in another form of contraception at this time as she reports she has tried IUD and depo, didn't like them, and is not eligible for estrogen containing method due to migraine with aura.   2. Intractable migraine with aura without status migrainosus Reviewed visit from neurology with patient. She seemed to recall visit after review. She noted that she likely put the medications that were prescribed in a bag with other things that she hasn't taken as prescribed. I discussed preventative medications vs abortive therapy and NSAID/APAP overuse with her. She was easily distracted and seemed to have some trouble retaining instructions regarding medications.  - topiramate (TOPAMAX) 25 MG tablet; Take one tablet at night for one week, then take 2 tablets at night  Dispense: 60 tablet; Refill: 3  3. Adjustment disorder with mixed anxiety and depressed mood PHQSADs with  significantly elevated scores. While i'm not sure this is related to nexplanon vs. Significant life changes with job, etc, we will remove nexplanon next week. Discussed that if these concerns do not resolve we should consider investigating further. Although she has had some passive SI, she denies active concerns today.   4. Hidradenitis axillaris Discussed home remedies including witch hazel, apple cider vinegar and tea tree oil to try given that she currently does not have insurance as she felt irritated that it was $89/mo. We discussed the importance of health insurance. Definitive management of the pocket may involve surgical excision which we discussed. No signs of secondary infection today.

## 2017-11-11 ENCOUNTER — Ambulatory Visit: Payer: Medicaid Other | Admitting: Pediatrics

## 2017-12-19 DIAGNOSIS — R350 Frequency of micturition: Secondary | ICD-10-CM | POA: Diagnosis not present

## 2017-12-19 DIAGNOSIS — L732 Hidradenitis suppurativa: Secondary | ICD-10-CM | POA: Diagnosis not present

## 2018-01-28 DIAGNOSIS — L732 Hidradenitis suppurativa: Secondary | ICD-10-CM | POA: Diagnosis not present

## 2018-07-02 ENCOUNTER — Ambulatory Visit (INDEPENDENT_AMBULATORY_CARE_PROVIDER_SITE_OTHER): Payer: Medicaid Other | Admitting: Family

## 2018-07-02 ENCOUNTER — Encounter: Payer: Self-pay | Admitting: Family

## 2018-07-02 ENCOUNTER — Other Ambulatory Visit: Payer: Self-pay

## 2018-07-02 VITALS — BP 106/71 | HR 64 | Ht 66.14 in | Wt 147.8 lb

## 2018-07-02 DIAGNOSIS — Z975 Presence of (intrauterine) contraceptive device: Secondary | ICD-10-CM | POA: Diagnosis not present

## 2018-07-02 DIAGNOSIS — Z113 Encounter for screening for infections with a predominantly sexual mode of transmission: Secondary | ICD-10-CM | POA: Diagnosis not present

## 2018-07-02 DIAGNOSIS — K219 Gastro-esophageal reflux disease without esophagitis: Secondary | ICD-10-CM

## 2018-07-02 DIAGNOSIS — N921 Excessive and frequent menstruation with irregular cycle: Secondary | ICD-10-CM | POA: Diagnosis not present

## 2018-07-02 DIAGNOSIS — Z3202 Encounter for pregnancy test, result negative: Secondary | ICD-10-CM

## 2018-07-02 MED ORDER — OMEPRAZOLE 20 MG PO CPDR: 20.0000 mg | DELAYED_RELEASE_CAPSULE | Freq: Two times a day (BID) | ORAL | 3 refills | Status: DC

## 2018-07-02 NOTE — Progress Notes (Signed)
History was provided by the patient.  Felicia Larsen is a 23 y.o. female who is here for acne concerns, doesn't want nexplanon.   PCP confirmed? Yes.    Maree Erie, MD  HPI:   -her acne has gotten really bad over the last few months -she is having longer periods with nexplanon -she has not been sexually active in about 6 months -she denies pelvic pain or abdominal pain -she has migraines with aura    -two issues: fix acne and help with chest pain/fullness after eating, esp when she lays down after eating   Review of Systems  Constitutional: Negative for chills, fever and malaise/fatigue.  HENT: Negative for sore throat.   Respiratory: Negative for cough, sputum production and shortness of breath.   Cardiovascular: Negative for chest pain, palpitations and orthopnea.  Gastrointestinal: Positive for heartburn. Negative for abdominal pain, constipation and diarrhea.  Genitourinary: Negative for dysuria, flank pain, frequency and hematuria.  Musculoskeletal: Negative for joint pain and myalgias.  Skin: Negative for rash.  Neurological: Negative for dizziness and headaches.  Psychiatric/Behavioral: Negative for depression.   Patient Active Problem List   Diagnosis Date Noted  . Migraine with aura 11/03/2017  . Renal disorder   . Splinter in skin 02/18/2017  . 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 Microscopic hematuria 03/18/2014  . Raynauds syndrome 02/16/2014  . Vaginal discharge 12/20/2013  . Slow transit constipation 10/06/2013  . Sleep disturbance 10/09/2012  . Allergic rhinitis 10/09/2012    Current Outpatient Medications on File Prior to Visit  Medication Sig Dispense Refill  . ibuprofen (ADVIL,MOTRIN) 200 MG tablet Take 200 mg by mouth every 6 (six) hours as needed.    . topiramate (TOPAMAX) 25 MG tablet Take one tablet at night for one week, then take 2 tablets at night (Patient not taking: Reported on 07/02/2018) 60 tablet 3   No  current facility-administered medications on file prior to visit.     Allergies  Allergen Reactions  . Latex Itching and Rash    IRRITATION    Physical Exam:    Vitals:   07/02/18 0850  BP: 106/71  Pulse: 64  Weight: 147 lb 12.8 oz (67 kg)  Height: 5' 6.14" (1.68 m)    Growth percentile SmartLinks can only be used for patients less than 57 years old. No LMP recorded.  Physical Exam Vitals signs reviewed.  Constitutional:      Appearance: Normal appearance.  HENT:     Head: Normocephalic.  Eyes:     Extraocular Movements: Extraocular movements intact.     Pupils: Pupils are equal, round, and reactive to light.  Neck:     Musculoskeletal: Normal range of motion.  Cardiovascular:     Rate and Rhythm: Normal rate and regular rhythm.     Heart sounds: No murmur.  Pulmonary:     Effort: Pulmonary effort is normal.     Breath sounds: Normal breath sounds.  Abdominal:     General: There is no distension.  Musculoskeletal: Normal range of motion.        General: No swelling.  Lymphadenopathy:     Cervical: No cervical adenopathy.  Skin:    General: Skin is warm and dry.     Capillary Refill: Capillary refill takes less than 2 seconds.     Findings: No rash.     Comments: Implant palpable in LUE  Neurological:     General: No focal deficit present.  Mental Status: She is alert and oriented to person, place, and time.  Psychiatric:        Mood and Affect: Mood normal.     Assessment/Plan: 1. GERD without esophagitis -omeprazole 20 mg BID after meals trial  2. Breakthrough bleeding on Nexplanon -screen for infections, discussed keeping the method despite having  -she cannot take COCs d/t contraindication to estrogen; reviewed other options; she is not open to IUD at this point; consider mefenamic acid if not infectious process.  3. Routine screening for STI (sexually transmitted infection) - C. trachomatis/N. gonorrhoeae RNA - WET PREP BY MOLECULAR PROBE

## 2018-07-03 LAB — C. TRACHOMATIS/N. GONORRHOEAE RNA
C. trachomatis RNA, TMA: NOT DETECTED
N. gonorrhoeae RNA, TMA: NOT DETECTED

## 2018-07-03 LAB — WET PREP BY MOLECULAR PROBE
Candida species: NOT DETECTED
Gardnerella vaginalis: NOT DETECTED
MICRO NUMBER:: 537619
SPECIMEN QUALITY:: ADEQUATE
Trichomonas vaginosis: NOT DETECTED

## 2018-07-09 ENCOUNTER — Ambulatory Visit: Payer: Medicaid Other | Admitting: Family

## 2018-07-09 ENCOUNTER — Telehealth: Payer: Self-pay

## 2018-07-09 NOTE — Telephone Encounter (Signed)
Pre-screening for in-office visit   1. Who is bringing the patient to the visit?   Patient   2. Has the person bringing the patient or the patient had contact with anyone with suspected or confirmed COVID-19 in the last 14 days?  no   3. Has the person bringing the patient or the patient had any of these symptoms in the last 14 days?    None reported  Fever (temp 100.4 F or higher) Difficulty breathing Cough Nausea Vomiting   If all answers are negative, advise patient to call our office prior to your appointment if you or the patient develop any of the symptoms listed above.--patient advised.

## 2018-07-10 ENCOUNTER — Encounter: Payer: Self-pay | Admitting: Family

## 2018-07-10 ENCOUNTER — Other Ambulatory Visit: Payer: Self-pay

## 2018-07-10 ENCOUNTER — Ambulatory Visit (INDEPENDENT_AMBULATORY_CARE_PROVIDER_SITE_OTHER): Payer: Medicaid Other | Admitting: Family

## 2018-07-10 VITALS — BP 112/71 | HR 65 | Ht 66.14 in | Wt 144.8 lb

## 2018-07-10 DIAGNOSIS — Z3046 Encounter for surveillance of implantable subdermal contraceptive: Secondary | ICD-10-CM

## 2018-07-10 NOTE — Progress Notes (Signed)
History was provided by the patient.  Felicia Larsen is a 23 y.o. female who is here for nexplanon removal.   PCP confirmed? Yes.    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  HENT: Negative for sore throat.   Eyes: Negative for blurred vision and double vision.  Respiratory: Negative for cough and shortness of breath.   Cardiovascular: Negative for chest pain and palpitations.  Gastrointestinal: Negative for abdominal pain.  Genitourinary: Negative for dysuria and frequency.  Musculoskeletal: Negative for joint pain and myalgias.  Skin: Negative for rash (acne on face, back ).     Patient Active Problem List   Diagnosis Date Noted  . Migraine with aura 11/03/2017  . Renal disorder   . 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 Microscopic hematuria 03/18/2014  . Raynauds syndrome 02/16/2014  . Slow transit constipation 10/06/2013  . Sleep disturbance 10/09/2012  . Allergic rhinitis 10/09/2012    Current Outpatient Medications on File Prior to Visit  Medication Sig Dispense Refill  . ibuprofen (ADVIL,MOTRIN) 200 MG tablet Take 200 mg by mouth every 6 (six) hours as needed.    Marland Kitchen. omeprazole (PRILOSEC) 20 MG capsule Take 1 capsule (20 mg total) by mouth 2 (two) times daily before a meal. (Patient not taking: Reported on 07/10/2018) 30 capsule 3  . topiramate (TOPAMAX) 25 MG tablet Take one tablet at night for one week, then take 2 tablets at night (Patient not taking: Reported on 07/02/2018) 60 tablet 3   No current facility-administered medications on file prior to visit.     Allergies  Allergen Reactions  . Latex Itching and Rash    IRRITATION    Physical Exam:     Vitals:   07/10/18 0829  BP: 112/71  Pulse: 65  Weight: 144 lb 12.8 oz (65.7 kg)  Height: 5' 6.14" (1.68 m)    Growth percentile SmartLinks can only be used for patients less than 21 years old. No LMP recorded.  Physical Exam HENT:     Head: Normocephalic.     Mouth/Throat:     Mouth: Mucous membranes are moist.  Eyes:     Extraocular Movements: Extraocular movements intact.     Pupils: Pupils are equal, round, and reactive to light.  Neck:     Musculoskeletal: Normal range of motion.  Cardiovascular:     Rate and Rhythm: Normal rate.  Pulmonary:     Effort: Pulmonary effort is normal.  Lymphadenopathy:     Cervical: No cervical adenopathy.  Skin:    General: Skin is warm and dry.     Comments: Mixed comedone acne on face, back  -implant in LUE correct position prior to removal  Neurological:     General: No focal deficit present.     Mental Status: She is alert and oriented to person, place, and time.  Psychiatric:        Mood and Affect: Mood normal.      Assessment/Plan: 1. Encounter for Nexplanon removal  - Remove drug implant device  Risks & benefits of Nexplanon removal discussed. Consent form signed.  The patient denies any allergies to anesthetics or antiseptics.  Procedure: Pt was placed in  supine position. left arm was flexed at the elbow and externally rotated so that her wrist was parallel to her ear, The device was palpated and marked. The site was cleaned with Betadine. The area surrounding the device was covered with a sterile drape. 1% lidocaine was injected just under the device. A scalpel was used to create a small incision. The device was pushed towards the incision. Fibrous tissue surrounding the device was gradually removed from the device. The device was removed and measured to ensure all 4 cm of device was removed. Steri-strips were used to close the incision. Pressure dressing was applied to the patient.  The patient was  instructed to removed the pressure dressing in 24 hrs.  The patient was advised to move slowly from a supine to an upright position  The patient denied any concerns or complaints  The patient was instructed to schedule a follow-up appt in 1 month. The patient will be called in 1 week to address any concerns.

## 2018-09-10 ENCOUNTER — Other Ambulatory Visit: Payer: Self-pay

## 2018-09-10 ENCOUNTER — Encounter: Payer: Self-pay | Admitting: Family

## 2018-09-10 ENCOUNTER — Other Ambulatory Visit (HOSPITAL_COMMUNITY)
Admission: RE | Admit: 2018-09-10 | Discharge: 2018-09-10 | Disposition: A | Discharge status: Home / Self Care | Payer: Medicaid Other | Source: Ambulatory Visit | Attending: Family | Admitting: Family

## 2018-09-10 ENCOUNTER — Ambulatory Visit (INDEPENDENT_AMBULATORY_CARE_PROVIDER_SITE_OTHER): Payer: Medicaid Other | Admitting: Family

## 2018-09-10 VITALS — BP 113/77 | HR 74 | Ht 66.54 in | Wt 141.0 lb

## 2018-09-10 DIAGNOSIS — N898 Other specified noninflammatory disorders of vagina: Secondary | ICD-10-CM | POA: Diagnosis not present

## 2018-09-10 DIAGNOSIS — Z1151 Encounter for screening for human papillomavirus (HPV): Secondary | ICD-10-CM | POA: Insufficient documentation

## 2018-09-10 DIAGNOSIS — Z3202 Encounter for pregnancy test, result negative: Secondary | ICD-10-CM

## 2018-09-10 DIAGNOSIS — N949 Unspecified condition associated with female genital organs and menstrual cycle: Secondary | ICD-10-CM | POA: Diagnosis not present

## 2018-09-10 LAB — POCT URINE PREGNANCY: Preg Test, Ur: NEGATIVE

## 2018-09-10 MED ORDER — FLUCONAZOLE 150 MG PO TABS: 150.0000 mg | ORAL_TABLET | Freq: Every day | ORAL | 0 refills | Status: DC

## 2018-09-10 MED ORDER — DOXYCYCLINE HYCLATE 100 MG PO CAPS: 100.0000 mg | ORAL_CAPSULE | Freq: Two times a day (BID) | ORAL | 0 refills | Status: DC

## 2018-09-10 MED ORDER — METRONIDAZOLE 500 MG PO TABS: 500.0000 mg | ORAL_TABLET | Freq: Two times a day (BID) | ORAL | 0 refills | Status: DC

## 2018-09-10 MED ORDER — LIDOCAINE HCL 1 % IJ SOLN
250.0000 mg | Freq: Once | INTRAMUSCULAR | Status: AC
  Administered 2018-09-10: 250 mg via INTRAMUSCULAR

## 2018-09-10 NOTE — Progress Notes (Signed)
History was provided by the patient.  Felicia Larsen is a 23 y.o. female who is here for vaginal irritation and pelvic pain  PCP confirmed? Yes.    Lurlean Leyden, MD  HPI:   -having burning with urination and labial itching x 3 days -has experienced the itching before  -not currently contracepted  -having some cramping and pain R>L lower abdomen -thinks she may have a yeast infection   Review of Systems  Constitutional: Negative for chills, fever and malaise/fatigue.  HENT: Negative for sore throat.   Eyes: Negative for blurred vision.  Respiratory: Negative for cough and shortness of breath.   Cardiovascular: Negative for chest pain and palpitations.  Gastrointestinal: Negative for abdominal pain, nausea and vomiting.  Genitourinary: Positive for dysuria. Negative for frequency.  Musculoskeletal: Negative for joint pain and myalgias.  Skin: Negative for rash.  Neurological: Negative for dizziness and headaches.  Psychiatric/Behavioral: The patient is nervous/anxious.      Patient Active Problem List   Diagnosis Date Noted  . Migraine with aura 11/03/2017  . Renal disorder   . 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 Microscopic hematuria 03/18/2014  . Raynauds syndrome 02/16/2014  . Slow transit constipation 10/06/2013  . Sleep disturbance 10/09/2012  . Allergic rhinitis 10/09/2012    Current Outpatient Medications on File Prior to Visit  Medication Sig Dispense Refill  . ibuprofen (ADVIL,MOTRIN) 200 MG tablet Take 200 mg by mouth every 6 (six) hours as needed.    Marland Kitchen omeprazole (PRILOSEC) 20 MG capsule Take 1 capsule (20 mg total) by mouth 2 (two) times daily before a meal. (Patient not taking: Reported on 07/10/2018) 30 capsule 3  . topiramate (TOPAMAX) 25 MG tablet Take one tablet at night for one week, then take 2 tablets at night (Patient not taking: Reported on 07/02/2018) 60 tablet 3   No current facility-administered medications on file  prior to visit.     Allergies  Allergen Reactions  . Latex Itching and Rash    IRRITATION    Physical Exam:    Vitals:   09/10/18 1208  BP: 113/77  Pulse: 74  Weight: 141 lb (64 kg)  Height: 5' 6.54" (1.69 m)    Growth percentile SmartLinks can only be used for patients less than 48 years old. No LMP recorded.  Physical Exam Vitals signs reviewed. Exam conducted with a chaperone present.  Constitutional:      General: She is not in acute distress.    Appearance: Normal appearance.  HENT:     Head: Normocephalic.  Eyes:     Extraocular Movements: Extraocular movements intact.     Pupils: Pupils are equal, round, and reactive to light.  Neck:     Musculoskeletal: Normal range of motion.  Cardiovascular:     Rate and Rhythm: Normal rate.  Pulmonary:     Effort: Pulmonary effort is normal.  Abdominal:     General: Abdomen is flat. There is no distension.     Tenderness: There is no abdominal tenderness.  Genitourinary:    Labia:        Left: Rash (microcluster of ulcerative lesions), tenderness and lesion present.      Vagina: Erythema present.     Cervix: Cervical motion tenderness and friability present.     Uterus: Normal.      Adnexa:        Right: Tenderness present. No mass.         Left: No mass or tenderness.  Musculoskeletal: Normal range of motion.        General: No swelling.  Lymphadenopathy:     Cervical: No cervical adenopathy.  Skin:    General: Skin is warm and dry.     Capillary Refill: Capillary refill takes less than 2 seconds.     Findings: No rash.  Neurological:     General: No focal deficit present.     Mental Status: She is alert and oriented to person, place, and time.  Psychiatric:        Mood and Affect: Mood normal.     Assessment/Plan: 1. CMT (cervical motion tenderness) -discussed empirical tx for PID based on exam findings -if no improvement in symptoms after tx, have low threshold for pelvic us for R adnexal tenderness   -  - cefTRIAXone (ROCEPHIN) 250 mg in lidocaine (XYLOCAINE) 1 % IM only syringe - C. trachomatis/N. gonorrhoeae RNA - WET PREP BY MOLECULAR PROBE - Herpes simplex virus (HSV), DNA by PCR  2. Vaginal lesion -suspicious for HSV; will await culture to treat   3. Screening for HPV (human papillomavirus) -due for cytology; will screen today  - Cytology - PAP  4. Pregnancy examination or test, negative result -negative - POCT urine pregnancy - doxycycline (VIBRAMYCIN) 100 MG capsule; Take 1 capsule (100 mg total) by mouth 2 (two) times daily.  Dispense: 28 capsule; Refill: 0 - metroNIDAZOLE (FLAGYL) 500 MG tablet; Take 1 tablet (500 mg total) by mouth 2 (two) times daily.  Dispense: 28 tablet; Refill: 0

## 2018-09-11 LAB — C. TRACHOMATIS/N. GONORRHOEAE RNA
C. trachomatis RNA, TMA: NOT DETECTED
N. gonorrhoeae RNA, TMA: NOT DETECTED

## 2018-09-11 LAB — WET PREP BY MOLECULAR PROBE
Candida species: NOT DETECTED
MICRO NUMBER:: 768590
SPECIMEN QUALITY:: ADEQUATE
Trichomonas vaginosis: NOT DETECTED

## 2018-09-12 ENCOUNTER — Encounter: Payer: Self-pay | Admitting: Family

## 2018-09-13 LAB — HSV DNA BY PCR (REFERENCE LAB)
HSV 1 DNA: DETECTED — AB
HSV 2 DNA: NOT DETECTED

## 2018-09-13 LAB — HERPES SIMPLEX VIRUS(HSV) DNA BY PCR

## 2018-09-14 ENCOUNTER — Other Ambulatory Visit: Payer: Self-pay | Admitting: Family

## 2018-09-14 ENCOUNTER — Telehealth: Payer: Self-pay | Admitting: Pediatrics

## 2018-09-14 ENCOUNTER — Ambulatory Visit: Payer: Medicaid Other | Admitting: Family

## 2018-09-14 LAB — CYTOLOGY - PAP
Adequacy: ABSENT
Chlamydia: NEGATIVE
Diagnosis: NEGATIVE
Neisseria Gonorrhea: NEGATIVE

## 2018-09-14 MED ORDER — VALACYCLOVIR HCL 1 G PO TABS: 1000.0000 mg | ORAL_TABLET | Freq: Every day | ORAL | 0 refills | Status: AC

## 2018-09-14 NOTE — Telephone Encounter (Signed)
Appointment made for discussion with the provider.

## 2018-09-14 NOTE — Telephone Encounter (Signed)
Patient called and requested to speak with Felicia Larsen, she wanted to discuss lab results with her. The Patient can be reached at the following number  [phone] whenever the provider has a chance.

## 2018-09-15 ENCOUNTER — Telehealth: Payer: Self-pay

## 2018-09-15 ENCOUNTER — Other Ambulatory Visit: Payer: Self-pay | Admitting: Family

## 2018-09-15 ENCOUNTER — Telehealth: Payer: Self-pay | Admitting: Pediatrics

## 2018-09-15 DIAGNOSIS — A6009 Herpesviral infection of other urogenital tract: Secondary | ICD-10-CM

## 2018-09-15 MED ORDER — LIDOCAINE 5 % EX OINT: 1.0000 "application " | TOPICAL_OINTMENT | CUTANEOUS | 0 refills | Status: DC | PRN

## 2018-09-15 NOTE — Telephone Encounter (Signed)
Spoke with patient about diagnosis and sent MyChart with information. She asks for lidocaine cream for lesions.

## 2018-09-15 NOTE — Telephone Encounter (Signed)
Spoke with pt and answered questions. Gave handout via MyChart.

## 2018-09-15 NOTE — Telephone Encounter (Signed)
Patient called yesterday and requested to speak with Hoyt Koch, she missed her appointment to talk with her. She is now requesting to speak to anyone about her lab results. She can be reached at the primary number in the chart.

## 2018-09-18 ENCOUNTER — Telehealth: Payer: Self-pay

## 2018-09-18 NOTE — Telephone Encounter (Signed)
Pt called in wanting to know when she was last tested for herpes. She was tested once this  month but she is curious about when she had that done previously. I also informed her she missed an appt on the 17th but she didn't want to reschedule just yet.

## 2018-09-21 NOTE — Telephone Encounter (Signed)
Called and spoke with patient. She was seen on Friday with the health department and they answered her questions. She denied a video visit with provider. She will follow up with clinic if needed.

## 2018-11-10 ENCOUNTER — Encounter: Payer: Self-pay | Admitting: Family Medicine

## 2018-11-10 ENCOUNTER — Other Ambulatory Visit: Payer: Self-pay

## 2018-11-10 ENCOUNTER — Ambulatory Visit (INDEPENDENT_AMBULATORY_CARE_PROVIDER_SITE_OTHER): Payer: 59 | Admitting: Family Medicine

## 2018-11-10 VITALS — BP 112/75 | HR 82 | Temp 98.4°F | Ht 66.54 in | Wt 139.2 lb

## 2018-11-10 DIAGNOSIS — L700 Acne vulgaris: Secondary | ICD-10-CM | POA: Diagnosis not present

## 2018-11-10 DIAGNOSIS — A6004 Herpesviral vulvovaginitis: Secondary | ICD-10-CM

## 2018-11-10 LAB — POCT URINALYSIS DIP (MANUAL ENTRY)
Bilirubin, UA: NEGATIVE
Blood, UA: NEGATIVE
Glucose, UA: NEGATIVE mg/dL
Ketones, POC UA: NEGATIVE mg/dL
Leukocytes, UA: NEGATIVE
Nitrite, UA: NEGATIVE
Protein Ur, POC: NEGATIVE mg/dL
Spec Grav, UA: >=1.03 — AB (ref 1.010–1.025)
Urobilinogen, UA: 0.2 E.U./dL
pH, UA: 6 (ref 5.0–8.0)

## 2018-11-10 MED ORDER — CLINDAMYCIN PHOS-BENZOYL PEROX 1-5 % EX GEL: Freq: Two times a day (BID) | CUTANEOUS | 5 refills | Status: DC

## 2018-11-10 MED ORDER — TRETINOIN 0.05 % EX CREA: TOPICAL_CREAM | Freq: Every day | CUTANEOUS | 5 refills | Status: DC

## 2018-11-10 NOTE — Patient Instructions (Addendum)
If you have lab work done today you will be contacted with your lab results within the next 2 weeks.  If you have not heard from [place] then please contact us. The fastest way to get your results is to register for My Chart.   IF you received an x-ray today, you will receive an invoice from [facility]. Please contact [facility] at [phone] with questions or concerns regarding your invoice.   IF you received labwork today, you will receive an invoice from [place]. Please contact [facility] at [phone] with questions or concerns regarding your invoice.   Our billing staff will not be able to assist you with questions regarding bills from these companies.  You will be contacted with the lab results as soon as they are available. The fastest way to get your results is to activate your My Chart account. Instructions are located on the last page of this paperwork. If you have not heard from [place] regarding the results in 2 weeks, please contact this office.     Acne  Acne is a skin problem that causes pimples and other skin changes. The skin has many tiny openings called pores. Each pore contains an oil gland. Oil glands make an oily substance that is called sebum. Acne occurs when the pores in the skin get blocked. The pores may become infected with bacteria, or they may become red, sore, and swollen. Acne is a common skin problem, especially for teenagers. It often occurs on the face, neck, chest, upper arms, and back. Acne usually goes away over time. What are the causes? Acne is caused when oil glands get blocked with sebum, dead skin cells, and dirt. The bacteria that are normally found in the oil glands then multiply and cause inflammation. Acne is commonly triggered by changes in your hormones. These hormonal changes can cause the oil glands to get bigger and to make more sebum. Factors that can make acne worse include:  Hormone changes  during: ? Adolescence. ? Women's menstrual cycles. ? Pregnancy.  Oil-based cosmetics and hair products.  Stress.  Hormone problems that are caused by certain diseases.  Certain medicines.  Pressure from headbands, backpacks, or shoulder pads.  Exposure to certain oils and chemicals.  Eating a diet high in carbohydrates that quickly turn to sugar. These include dairy products, desserts, and chocolates. What increases the risk? This condition is more likely to develop in:  Teenagers.  People who have a family history of acne. What are the signs or symptoms? Symptoms include:  Small, red bumps (pimples or papules).  Whiteheads.  Blackheads.  Small, pus-filled pimples (pustules).  Big, red pimples or pustules that feel tender. More severe acne can cause:  An abscess. This is an infected area that contains a collection of pus.  Cysts. These are hard, painful, fluid-filled sacs.  Scars. These can happen after large pimples heal. How is this diagnosed? This condition is diagnosed with a medical history and physical exam. Blood tests may also be done. How is this treated? Treatment for this condition can vary depending on the severity of your acne. Treatment may include:  Creams and lotions that prevent oil glands from clogging.  Creams and lotions that treat or prevent infections and inflammation.  Antibiotic medicines that are applied to the skin or taken as a pill.  Pills that decrease sebum production.  Birth control pills.  Light or laser treatments.  Injections of medicine into the affected areas.  Chemicals that cause  peeling of the skin.  Surgery. Your health care provider will also recommend the best way to take care of your skin. Good skin care is the most important part of treatment. Follow these instructions at home: Skin care Take care of your skin as told by your health care provider. You may be told to do these things:  Wash your skin  gently at least two times each day, as well as: ? After you exercise. ? Before you go to bed.  Use mild soap.  Apply a water-based skin moisturizer after you wash your skin.  Use a sunscreen or sunblock with SPF 30 or greater. This is especially important if you are using acne medicines.  Choose cosmetics that will not block your oil glands (are noncomedogenic). Medicines  Take over-the-counter and prescription medicines only as told by your health care provider.  If you were prescribed an antibiotic medicine, apply it or take it as told by your health care provider. Do not stop using the antibiotic even if your condition improves. General instructions  Keep your hair clean and off your face. If you have oily hair, shampoo your hair regularly or daily.  Avoid wearing tight headbands or hats.  Avoid picking or squeezing your pimples. That can make your acne worse and cause scarring.  Shave gently and only when necessary.  Keep a food journal to figure out if any foods are linked to your acne. Avoid dairy products, desserts, and chocolates.  Take steps to manage and reduce stress.  Keep all follow-up visits as told by your health care provider. This is important. Contact a health care provider if:  Your acne is not better after eight weeks.  Your acne gets worse.  You have a large area of skin that is red or tender.  You think that you are having side effects from any acne medicine. Summary  Acne is a skin problem that causes pimples and other skin changes. Acne is a common skin problem, especially for teenagers. Acne usually goes away over time.  Acne is commonly triggered by changes in your hormones. There are many other causes, such as stress, diet, and certain medicines.  Follow your health care provider's instructions for how to take care of your skin. Good skin care is the most important part of treatment.  Take over-the-counter and prescription medicines only as  told by your health care provider.  Contact your health care provider if you think that you are having side effects from any acne medicine. This information is not intended to replace advice given to you by your health care provider. Make sure you discuss any questions you have with your health care provider. Document Released: 01/12/2000 Document Revised: 05/27/2017 Document Reviewed: 05/27/2017 Elsevier Patient Education  2020 [employer].

## 2018-11-10 NOTE — Progress Notes (Signed)
10/13/20201:41 PM  Felicia Larsen Aug 22, 1995, 23 y.o., female [id number]  Chief Complaint  Patient presents with  . Rash    has breakout on the cheek in the face, dx with herpes 1 last month. Wants to know more information on that as well    HPI:   Patient is a 23 y.o. female who presents today for acne  Has been getting worse over the past year Has been using OTC products Not on [facility] currently, feels acne got worse when placed, not interested Has been using neosporin  Has many questions regarding recent genital HSV diagnosis   Depression screen [facility] 2/9 11/10/2018 11/04/2017 06/18/2012  Decreased Interest 0 3 2  Down, Depressed, Hopeless 0 3 0  PHQ - 2 Score 0 6 2  Altered sleeping - 3 1  Tired, decreased energy - 3 1  Change in appetite - 1 0  Feeling bad or failure about yourself  - 3 0  Trouble concentrating - 2 0  Moving slowly or fidgety/restless - 1 0  Suicidal thoughts - - 0  PHQ-9 Score - 19 4    Fall Risk  11/10/2018  Falls in the past year? 0  Number falls in past yr: 0  Injury with Fall? 0     Allergies  Allergen Reactions  . Latex Itching and Rash    IRRITATION    Prior to Admission medications   Not on File    Past Medical History:  Diagnosis Date  . Anal fissure   . Chlamydia 12/22/2013  . Constipation   . Headache(784.0)   . Renal calculi   . Renal disorder   . Urinary tract infection    E.Coli resistant to Bactrim    No past surgical history on file.  Social History   Tobacco Use  . Smoking status: Never Smoker  . Smokeless tobacco: Never Used  Substance Use Topics  . Alcohol use: No    Alcohol/week: 0.0 standard drinks    Family History  Problem Relation Age of Onset  . Kidney Stones Mother   . GI problems Mother   . Hypertension Mother   . Migraines Maternal Grandmother   . Heart disease Maternal Grandmother   . Stroke Maternal Grandmother   . Asthma Sister   . Asthma Brother   . Diabetes Paternal Grandmother    . Migraines Paternal Grandfather   . Asthma Brother   . Allergic rhinitis Brother   . Migraines Maternal Aunt   . Migraines Maternal Uncle     ROS Per hpi  OBJECTIVE:  Today's Vitals   11/10/18 1336  BP: 112/75  Pulse: 82  Temp: 98.4 F (36.9 C)  SpO2: 100%  Weight: 139 lb 3.2 oz (63.1 kg)  Height: 5' 6.54" (1.69 m)   Body mass index is 22.1 kg/m.   Physical Exam   Gen: AAOx3, NAD Skin: nodular acne, scarring, hyperpigmented areas, mostly along cheeks.   No results found for this or any previous visit (from the past 24 hour(s)).  No results found.   ASSESSMENT and PLAN  1. Nodular acne Discussed supportive measures, new meds r/se/b and RTC precautions. Patient educational handout given. - Ambulatory referral to Dermatology - POCT urinalysis dipstick  2. Herpes simplex vulvovaginitis More than 50% of this 20 minutes visit was spent on education and counseling  Other orders - clindamycin-benzoyl peroxide (BENZACLIN) gel; Apply topically 2 (two) times daily. - tretinoin (RETIN-A) 0.05 % cream; Apply topically at bedtime.  Return if symptoms  worsen or fail to improve.    Rutherford Guys, MD Primary Care at [facility] [place], [place] [address] Ph.  [phone] Fax [phone]

## 2018-11-20 DIAGNOSIS — L700 Acne vulgaris: Secondary | ICD-10-CM | POA: Diagnosis not present

## 2018-12-04 ENCOUNTER — Ambulatory Visit: Payer: 59 | Admitting: Family Medicine

## 2018-12-08 ENCOUNTER — Encounter: Payer: Self-pay | Admitting: Family Medicine

## 2019-01-07 ENCOUNTER — Ambulatory Visit: Payer: 59 | Admitting: Family Medicine

## 2019-02-18 ENCOUNTER — Telehealth (INDEPENDENT_AMBULATORY_CARE_PROVIDER_SITE_OTHER): Payer: No Typology Code available for payment source | Admitting: Family Medicine

## 2019-02-18 ENCOUNTER — Other Ambulatory Visit: Payer: Self-pay

## 2019-02-18 DIAGNOSIS — A6004 Herpesviral vulvovaginitis: Secondary | ICD-10-CM | POA: Diagnosis not present

## 2019-02-18 MED ORDER — VALACYCLOVIR HCL 1 G PO TABS: 1000.0000 mg | ORAL_TABLET | Freq: Every day | ORAL | 3 refills | Status: DC

## 2019-02-18 NOTE — Progress Notes (Signed)
   Virtual Visit Note  I connected with patient on 02/18/19 at 1206pm by phone and verified that I am speaking with the correct person using two identifiers. Felicia Larsen is currently located at home and patient is currently with them during visit. The provider, Myles Lipps, MD is located in their office at time of visit.  I discussed the limitations, risks, security and privacy concerns of performing an evaluation and management service by telephone and the availability of in person appointments. I also discussed with the patient that there may be a patient responsible charge related to this service. The patient expressed understanding and agreed to proceed.   CC: herpes outbreak  HPI ? Her grandmother was positive for covid  Patient reports body aches, loss of taste and smell, she got tested yesterday  She started having intense burning pain, pressure in left labia today No lesions  She has had herpes in same location  Allergies  Allergen Reactions  . Latex Itching and Rash    IRRITATION    Prior to Admission medications   Medication Sig Start Date End Date Taking? Authorizing Provider  clindamycin-benzoyl peroxide (BENZACLIN) gel Apply topically 2 (two) times daily. Patient not taking: Reported on 02/18/2019 11/10/18   Myles Lipps, MD  tretinoin (RETIN-A) 0.05 % cream Apply topically at bedtime. Patient not taking: Reported on 02/18/2019 11/10/18   Myles Lipps, MD    Past Medical History:  Diagnosis Date  . Anal fissure   . Chlamydia 12/22/2013  . Constipation   . Headache(784.0)   . Renal calculi   . Renal disorder   . Urinary tract infection    E.Coli resistant to Bactrim    No past surgical history on file.  Social History   Tobacco Use  . Smoking status: Never Smoker  . Smokeless tobacco: Never Used  Substance Use Topics  . Alcohol use: No    Alcohol/week: 0.0 standard drinks    Family History  Problem Relation Age of Onset  .  Kidney Stones Mother   . GI problems Mother   . Hypertension Mother   . Migraines Maternal Grandmother   . Heart disease Maternal Grandmother   . Stroke Maternal Grandmother   . Asthma Sister   . Asthma Brother   . Diabetes Paternal Grandmother   . Migraines Paternal Grandfather   . Asthma Brother   . Allergic rhinitis Brother   . Migraines Maternal Aunt   . Migraines Maternal Uncle     ROS Per hpi  Objective  Vitals as reported by the patient: none   ASSESSMENT and PLAN  1. Herpes simplex vulvovaginitis Discussed supportive measures, new meds r/se/b and RTC precautions.   Other orders - valACYclovir (VALTREX) 1000 MG tablet; Take 1 tablet (1,000 mg total) by mouth daily. Take at onset of hepes flareup  FOLLOW-UP: prn   The above assessment and management plan was discussed with the patient. The patient verbalized understanding of and has agreed to the management plan. Patient is aware to call the clinic if symptoms persist or worsen. Patient is aware when to return to the clinic for a follow-up visit. Patient educated on when it is appropriate to go to the emergency department.    I provided 7 minutes of non-face-to-face time during this encounter.  Myles Lipps, MD Primary Care at [facility] [address] [place], [place] [address] Ph.  [phone] Fax [phone]

## 2019-02-18 NOTE — Progress Notes (Signed)
CC: Patient states she needs medication for herpes outbreak. Patient went to an urgent care yesterday to get tested for COVID, she is waiting for results.

## 2019-03-08 ENCOUNTER — Telehealth: Payer: Self-pay | Admitting: Family Medicine

## 2019-03-08 NOTE — Telephone Encounter (Signed)
Patient stated she need a letter covering her from 02/24/19-03/03/2019 and return back to work due to positive covid test. I do not see where patient had a positive covid test but stated test was done by the urgent care. They gave her a note to cover her from 02/03/19-02/17/19 but that was as far as they would cover her. Patient have not been seen here for covid or a covid f/u. Patient stated she had another covid test recently thinking company just needed a neg covid test for her to return but they also need a letter to cover 1/27-03/03/19 as well. Is this ok to give a note? Or should we get patient a tele-med visit along with documentation on her positive and negative covid results

## 2019-03-08 NOTE — Telephone Encounter (Signed)
Patient requesting a call back in regards to  Documents that have to do with her returning to work...   Patient did not want to further disclose   Please advise

## 2019-03-09 NOTE — Telephone Encounter (Signed)
Pt called requesting her letter be sent to her mychart and she would like it today so that she can return to work by at least tomorrow. Her job is requiring a letter today. Pt confirmed 02/24/19-03/03/19

## 2019-03-09 NOTE — Telephone Encounter (Signed)
Yes ok to give letter, we discussed her sx and the fact that she was waiting for results on telemedicine visit on jan 21. thanks

## 2019-04-22 ENCOUNTER — Other Ambulatory Visit: Payer: Self-pay

## 2019-04-22 ENCOUNTER — Other Ambulatory Visit (HOSPITAL_COMMUNITY)
Admission: RE | Admit: 2019-04-22 | Discharge: 2019-04-22 | Disposition: A | Discharge status: Home / Self Care | Payer: 59 | Source: Ambulatory Visit | Attending: Adult Health Nurse Practitioner | Admitting: Adult Health Nurse Practitioner

## 2019-04-22 ENCOUNTER — Ambulatory Visit (INDEPENDENT_AMBULATORY_CARE_PROVIDER_SITE_OTHER): Payer: 59 | Admitting: Adult Health Nurse Practitioner

## 2019-04-22 VITALS — BP 104/70 | HR 86 | Temp 97.6°F | Ht 66.0 in | Wt 135.6 lb

## 2019-04-22 DIAGNOSIS — Z113 Encounter for screening for infections with a predominantly sexual mode of transmission: Secondary | ICD-10-CM | POA: Diagnosis present

## 2019-04-22 DIAGNOSIS — N12 Tubulo-interstitial nephritis, not specified as acute or chronic: Secondary | ICD-10-CM | POA: Diagnosis not present

## 2019-04-22 LAB — POCT WET + KOH PREP
Trich by wet prep: ABSENT
Yeast by KOH: ABSENT
Yeast by wet prep: ABSENT

## 2019-04-22 LAB — POCT URINALYSIS DIP (MANUAL ENTRY)
Bilirubin, UA: NEGATIVE
Blood, UA: NEGATIVE
Glucose, UA: NEGATIVE mg/dL
Leukocytes, UA: NEGATIVE
Nitrite, UA: NEGATIVE
Protein Ur, POC: NEGATIVE mg/dL
Spec Grav, UA: >=1.03 — AB (ref 1.010–1.025)
Urobilinogen, UA: 0.2 E.U./dL
pH, UA: 5.5 (ref 5.0–8.0)

## 2019-04-22 NOTE — Patient Instructions (Signed)
° ° ° °  If you have lab work done today you will be contacted with your lab results within the next 2 weeks.  If you have not heard from us then please contact us. The fastest way to get your results is to register for My Chart. ° ° °IF you received an x-ray today, you will receive an invoice from [facility]. Please contact [facility] at [phone] with questions or concerns regarding your invoice.  ° °IF you received labwork today, you will receive an invoice from [facility]. Please contact [facility] at [phone] with questions or concerns regarding your invoice.  ° °Our billing staff will not be able to assist you with questions regarding bills from these companies. ° °You will be contacted with the lab results as soon as they are available. The fastest way to get your results is to activate your My Chart account. Instructions are located on the last page of this paperwork. If you have not heard from us regarding the results in 2 weeks, please contact this office. °  ° ° ° °

## 2019-04-23 LAB — RPR: RPR Ser Ql: NONREACTIVE

## 2019-04-23 LAB — HIV ANTIBODY (ROUTINE TESTING W REFLEX): HIV Screen 4th Generation wRfx: NONREACTIVE

## 2019-04-26 LAB — GC/CHLAMYDIA PROBE AMP (~~LOC~~) NOT AT ARMC
Chlamydia: NEGATIVE
Comment: NEGATIVE
Comment: NORMAL
Neisseria Gonorrhea: NEGATIVE

## 2019-05-12 ENCOUNTER — Ambulatory Visit (INDEPENDENT_AMBULATORY_CARE_PROVIDER_SITE_OTHER): Payer: Medicaid Other | Admitting: Adult Health Nurse Practitioner

## 2019-05-12 ENCOUNTER — Encounter: Payer: Self-pay | Admitting: Adult Health Nurse Practitioner

## 2019-05-12 ENCOUNTER — Other Ambulatory Visit: Payer: Self-pay

## 2019-05-12 VITALS — BP 112/79 | HR 68 | Temp 98.2°F | Ht 66.0 in | Wt 135.8 lb

## 2019-05-12 DIAGNOSIS — N12 Tubulo-interstitial nephritis, not specified as acute or chronic: Secondary | ICD-10-CM

## 2019-05-12 HISTORY — DX: Tubulo-interstitial nephritis, not specified as acute or chronic: N12

## 2019-05-12 LAB — POCT URINALYSIS DIP (MANUAL ENTRY)
Glucose, UA: NEGATIVE mg/dL
Nitrite, UA: POSITIVE — AB
Protein Ur, POC: >=300 mg/dL — AB
Spec Grav, UA: 1.025 (ref 1.010–1.025)
Urobilinogen, UA: 1 E.U./dL
pH, UA: 6.5 (ref 5.0–8.0)

## 2019-05-12 MED ORDER — CIPROFLOXACIN HCL 250 MG PO TABS: 250.0000 mg | ORAL_TABLET | Freq: Two times a day (BID) | ORAL | 0 refills | Status: DC

## 2019-05-12 NOTE — Patient Instructions (Signed)
° ° ° °  If you have lab work done today you will be contacted with your lab results within the next 2 weeks.  If you have not heard from us then please contact us. The fastest way to get your results is to register for My Chart. ° ° °IF you received an x-ray today, you will receive an invoice from [facility]. Please contact [facility] at [phone] with questions or concerns regarding your invoice.  ° °IF you received labwork today, you will receive an invoice from [facility]. Please contact [facility] at [phone] with questions or concerns regarding your invoice.  ° °Our billing staff will not be able to assist you with questions regarding bills from these companies. ° °You will be contacted with the lab results as soon as they are available. The fastest way to get your results is to activate your My Chart account. Instructions are located on the last page of this paperwork. If you have not heard from us regarding the results in 2 weeks, please contact this office. °  ° ° ° °

## 2019-05-12 NOTE — Progress Notes (Signed)
SUBJECTIVE:  24 y.o. female complains of white, malodorous and thin vaginal discharge for 4 day(s). Denies abnormal vaginal bleeding or significant pelvic pain or fever. No UTI symptoms. Denies history of known exposure to STD.  Patient's last menstrual period was 04/11/2019.  OBJECTIVE:  She appears well, afebrile. Abdomen: benign, soft, nontender, no masses. Pelvic Exam: examination not indicated. Urine dipstick: negative for all components. KOH:  Negative for yeast, trich, or BV   ASSESSMENT:  no pathogens identified causing these symptoms  PLAN:  GC and chlamydia DNA  probe sent to lab. Treatment: No treatment needed currently.  ROV prn if symptoms persist or worsen.  Will check STI testing: Orders Placed This Encounter  Procedures  . HIV antibody (with reflex)  . RPR  . POCT urinalysis dipstick  . POCT Wet + KOH Prep   Patient inline with plan.  Will call or return if further evaluation needed.  She is satisfied with this plan.   Elyse Jarvis, NP

## 2019-05-12 NOTE — Progress Notes (Signed)
      05/12/2019  Felicia Larsen 1995-10-28 [id number]  SUBJECTIVE: Felicia Larsen is a 24 y.o. female who complains of urinary frequency, urgency and dysuria x 3 days, without flank pain, fever, chills, or abnormal vaginal discharge or bleeding.   OBJECTIVE: Appears well, in no apparent distress.  Vital signs are normal. The abdomen is soft without tenderness, guarding, mass, rebound or organomegaly. No CVA tenderness or inguinal adenopathy noted. Urine dipstick shows positive for WBC's, positive for RBC's, positive for protein, positive for nitrates, positive for leukocytes and positive for ketones.   ASSESSMENT: Pyelonephritis  PLAN: Treatment per orders - also push fluids, may use Pyridium OTC prn. Call or return to clinic prn if these symptoms worsen or fail to improve as anticipated.  Orders Placed This Encounter  Procedures  . POCT urinalysis dipstick   Meds ordered this encounter  Medications  . ciprofloxacin (CIPRO) 250 MG tablet    Sig: Take 1 tablet (250 mg total) by mouth 2 (two) times daily.    Dispense:  6 tablet    Refill:  0

## 2019-05-15 LAB — URINE CULTURE

## 2019-06-27 DIAGNOSIS — R0981 Nasal congestion: Secondary | ICD-10-CM | POA: Diagnosis not present

## 2019-06-27 DIAGNOSIS — N760 Acute vaginitis: Secondary | ICD-10-CM | POA: Diagnosis not present

## 2019-06-27 DIAGNOSIS — R05 Cough: Secondary | ICD-10-CM | POA: Diagnosis not present

## 2019-06-27 DIAGNOSIS — J029 Acute pharyngitis, unspecified: Secondary | ICD-10-CM | POA: Diagnosis not present

## 2019-07-26 ENCOUNTER — Ambulatory Visit: Payer: Medicaid Other | Admitting: Emergency Medicine

## 2019-07-27 ENCOUNTER — Encounter: Payer: Self-pay | Admitting: Emergency Medicine

## 2019-07-28 DIAGNOSIS — S8392XA Sprain of unspecified site of left knee, initial encounter: Secondary | ICD-10-CM | POA: Diagnosis not present

## 2019-07-30 ENCOUNTER — Encounter (HOSPITAL_COMMUNITY): Payer: Self-pay

## 2019-07-30 ENCOUNTER — Other Ambulatory Visit: Payer: Self-pay

## 2019-07-30 ENCOUNTER — Emergency Department (HOSPITAL_COMMUNITY)
Admission: EM | Admit: 2019-07-30 | Discharge: 2019-07-30 | Disposition: A | Discharge status: Home / Self Care | Payer: Medicaid Other | Attending: Emergency Medicine | Admitting: Emergency Medicine

## 2019-07-30 ENCOUNTER — Emergency Department (HOSPITAL_COMMUNITY): Payer: Medicaid Other

## 2019-07-30 DIAGNOSIS — G8929 Other chronic pain: Secondary | ICD-10-CM | POA: Diagnosis not present

## 2019-07-30 DIAGNOSIS — M25562 Pain in left knee: Secondary | ICD-10-CM | POA: Insufficient documentation

## 2019-07-30 DIAGNOSIS — Z91040 Latex allergy status: Secondary | ICD-10-CM | POA: Diagnosis not present

## 2019-07-30 NOTE — ED Provider Notes (Signed)
[facility]-EMERGENCY DEPT Provider Note   CSN: [id number] Arrival date & time: 07/30/19  1208     History Chief Complaint  Patient presents with  . Knee Pain    Felicia Larsen is a 24 y.o. female.  HPI 24 year old female with 2-year history of left knee pain presents to the ER for worsening knee pain.  Patient states that approximately 2 years ago, she fell onto both her knees and had some swelling around her knees.  Her pain and swelling improved, and then her pain would only return when the weather was cold.  Recently she started a job at [employer] which requires a lot of standing and walking, and she has noticed that she is having worsening pain in her left knee.  She states that because she has been compensating with her right knee so much, she is not having pain in her right knee.  She has tried wearing braces with little relief.  No fevers or chills, no swelling, warmth or redness.  No history of IVDU.  No nausea or vomiting or abdominal pain.  She has never sought medical care for this.  She states that she takes Tylenol intermittently which moderately helps.    Past Medical History:  Diagnosis Date  . Anal fissure   . Chlamydia 12/22/2013  . Constipation   . Headache(784.0)   . Renal calculi   . Renal disorder   . Urinary tract infection    E.Coli resistant to Bactrim    Patient Active Problem List   Diagnosis Date Noted  . Pyelonephritis 05/12/2019  . Migraine with aura 11/03/2017  . Renal disorder   . 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 Microscopic hematuria 03/18/2014  . Raynauds syndrome 02/16/2014  . Slow transit constipation 10/06/2013  . Sleep disturbance 10/09/2012  . Allergic rhinitis 10/09/2012    History reviewed. No pertinent surgical history.   OB History   No obstetric history on file.     Family History  Problem Relation Age of Onset  . Kidney Stones Mother   . GI problems Mother   .  Hypertension Mother   . Migraines Maternal Grandmother   . Heart disease Maternal Grandmother   . Stroke Maternal Grandmother   . Asthma Sister   . Asthma Brother   . Diabetes Paternal Grandmother   . Migraines Paternal Grandfather   . Asthma Brother   . Allergic rhinitis Brother   . Migraines Maternal Aunt   . Migraines Maternal Uncle     Social History   Tobacco Use  . Smoking status: Never Smoker  . Smokeless tobacco: Never Used  Vaping Use  . Vaping Use: Never used  Substance Use Topics  . Alcohol use: No    Alcohol/week: 0.0 standard drinks  . Drug use: No    Home Medications Prior to Admission medications   Medication Sig Start Date End Date Taking? Authorizing Provider  ciprofloxacin (CIPRO) 250 MG tablet Take 1 tablet (250 mg total) by mouth 2 (two) times daily. 05/12/19   Royal Hawthorn, NP  clindamycin-benzoyl peroxide (BENZACLIN) gel Apply topically 2 (two) times daily. Patient not taking: Reported on 05/12/2019 11/10/18   Myles Lipps, MD  tretinoin (RETIN-A) 0.05 % cream Apply topically at bedtime. Patient not taking: Reported on 05/12/2019 11/10/18   Myles Lipps, MD  valACYclovir (VALTREX) 1000 MG tablet Take 1 tablet (1,000 mg total) by mouth daily. Take at onset of hepes flareup Patient not taking:  Reported on 05/12/2019 02/18/19   Myles Lipps, MD    Allergies    Latex  Review of Systems   Review of Systems  Constitutional: Negative for chills and fever.  Musculoskeletal: Positive for arthralgias. Negative for back pain and joint swelling.  Skin: Negative for color change and rash.    Physical Exam Updated Vital Signs BP 131/85 (BP Location: Right Arm)   Pulse (!) 107   Temp 99.1 F (37.3 C) (Oral)   Resp 16   Ht 5\' 6"  (1.676 m)   Wt 63 kg   LMP 07/09/2019 (Approximate)   SpO2 100%   BMI 22.44 kg/m   Physical Exam Vitals and nursing note reviewed.  Constitutional:      General: She is not in acute distress.     Appearance: Normal appearance. She is well-developed.  HENT:     Head: Normocephalic and atraumatic.     Mouth/Throat:     Mouth: Mucous membranes are moist.  Eyes:     Conjunctiva/sclera: Conjunctivae normal.  Cardiovascular:     Rate and Rhythm: Normal rate and regular rhythm.     Pulses: Normal pulses.     Heart sounds: Normal heart sounds. No murmur heard.   Pulmonary:     Effort: Pulmonary effort is normal. No respiratory distress.     Breath sounds: Normal breath sounds.  Abdominal:     General: Abdomen is flat.     Palpations: Abdomen is soft.     Tenderness: There is no abdominal tenderness.  Musculoskeletal:        General: No swelling, tenderness, deformity or signs of injury. Normal range of motion.     Cervical back: Neck supple.     Right lower leg: No edema.     Left lower leg: No edema.     Comments: Bilateral knees without evidence of swelling, erythema, warmth.  Full range of motion, 5/5 strength in both extremities, sensations intact.  No popliteal swelling.  No lower extremity edema.  Skin:    General: Skin is warm and dry.     Findings: No erythema or rash.  Neurological:     General: No focal deficit present.     Mental Status: She is alert and oriented to person, place, and time.     Sensory: No sensory deficit.     Motor: No weakness.  Psychiatric:        Mood and Affect: Mood normal.        Behavior: Behavior normal.     ED Results / Procedures / Treatments   Labs (all labs ordered are listed, but only abnormal results are displayed) Labs Reviewed - No data to display  EKG None  Radiology DG Knee Complete 4 Views Left  Result Date: 07/30/2019 CLINICAL DATA:  Chronic left knee pain.  No recent injury. EXAM: LEFT KNEE - COMPLETE 4+ VIEW COMPARISON:  None. FINDINGS: No evidence of fracture, dislocation, or joint effusion. No evidence of arthropathy or other focal bone abnormality. Soft tissues are unremarkable. IMPRESSION: Negative. Electronically  Signed   By: 09/30/2019 M.D.   On: 07/30/2019 13:13   DG Knee Complete 4 Views Right  Result Date: 07/30/2019 CLINICAL DATA:  Chronic right knee pain.  No recent injury. EXAM: RIGHT KNEE - COMPLETE 4+ VIEW COMPARISON:  None. FINDINGS: No evidence of fracture, dislocation, or joint effusion. No evidence of arthropathy or other focal bone abnormality. Soft tissues are unremarkable. IMPRESSION: Negative. Electronically Signed   By: 09/30/2019  Derry M.D.   On: 07/30/2019 13:13    Procedures Procedures (including critical care time)  Medications Ordered in ED Medications - No data to display  ED Course  I have reviewed the triage vital signs and the nursing notes.  Pertinent labs & imaging results that were available during my care of the patient were reviewed by me and considered in my medical decision making (see chart for details).    MDM Rules/Calculators/A&P                          Patient with bilateral knee pain.  No evidence of knee swelling, tightness, restricted range of motion.  No erythema or warmth.  History of IVDU.  Doubt septic joint, gout. No recent injuries.  Plain films not evidence of fracture, deformities.  Encouraged the patient to follow-up with orthopedics, referral given.  Offered Toradol shot which she refused.  Encouraged her to continue to take over-the-counter Tylenol.  Patient states that she has braces at home and does not need braces here.  Patient stable for DC to home.  Return precautions given.  All the patient's questions have been answered to her satisfaction, she voices understanding is agreeable to this plan.  At this stage in ED course, patient has been adequately medically screened and is stable for discharge.   Final Clinical Impression(s) / ED Diagnoses Final diagnoses:  Chronic pain of both knees    Rx / DC Orders ED Discharge Orders    None       Leone Brand 07/30/19 1331    Pollyann Savoy, MD 07/30/19 1454

## 2019-07-30 NOTE — Discharge Instructions (Signed)
Your today was overall reassuring.  Please make sure to follow-up with orthopedics.  Please take Tylenol/ibuprofen for pain.  Return to the ER if your symptoms worsen.

## 2019-07-30 NOTE — ED Triage Notes (Signed)
Patient c/o bilateral knee pain. Patient states that she fell 2 years ago and hurt her knees. Patient states she has started a new job and her left knee started hurting. Patient states she has been putting the majority of weight on on her right knee and now both knees are hurting.

## 2019-08-16 ENCOUNTER — Emergency Department (HOSPITAL_COMMUNITY)
Admission: EM | Admit: 2019-08-16 | Discharge: 2019-08-16 | Disposition: A | Discharge status: Home / Self Care | Payer: Medicaid Other | Attending: Emergency Medicine | Admitting: Emergency Medicine

## 2019-08-16 ENCOUNTER — Other Ambulatory Visit: Payer: Self-pay

## 2019-08-16 DIAGNOSIS — Y990 Civilian activity done for income or pay: Secondary | ICD-10-CM | POA: Insufficient documentation

## 2019-08-16 DIAGNOSIS — M546 Pain in thoracic spine: Secondary | ICD-10-CM | POA: Diagnosis present

## 2019-08-16 DIAGNOSIS — S29012A Strain of muscle and tendon of back wall of thorax, initial encounter: Secondary | ICD-10-CM | POA: Insufficient documentation

## 2019-08-16 DIAGNOSIS — T148XXA Other injury of unspecified body region, initial encounter: Secondary | ICD-10-CM

## 2019-08-16 DIAGNOSIS — Y9289 Other specified places as the place of occurrence of the external cause: Secondary | ICD-10-CM | POA: Insufficient documentation

## 2019-08-16 DIAGNOSIS — Y9389 Activity, other specified: Secondary | ICD-10-CM | POA: Diagnosis not present

## 2019-08-16 DIAGNOSIS — X501XXA Overexertion from prolonged static or awkward postures, initial encounter: Secondary | ICD-10-CM | POA: Insufficient documentation

## 2019-08-16 DIAGNOSIS — Z91040 Latex allergy status: Secondary | ICD-10-CM | POA: Diagnosis not present

## 2019-08-16 MED ORDER — METHOCARBAMOL 500 MG PO TABS: 500.0000 mg | ORAL_TABLET | Freq: Two times a day (BID) | ORAL | 0 refills | Status: DC

## 2019-08-16 NOTE — ED Triage Notes (Signed)
Pt sts upper back pain that is recurrent. 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

## 2019-08-16 NOTE — Discharge Instructions (Signed)
Please use Tylenol or ibuprofen for pain.  You may use 600 mg ibuprofen every 6 hours or 1000 mg of Tylenol every 6 hours.  You may choose to alternate between the 2.  This would be most effective.  Not to exceed 4 g of Tylenol within 24 hours.  Not to exceed 3200 mg ibuprofen 24 hours.  Your examination today is most concerning for a muscular injury 1. Medications: alternate ibuprofen and tylenol for pain control, take all usual home medications as they are prescribed 2. Treatment: rest, ice, elevate and use an ACE wrap or other compressive therapy to decrease swelling. Also drink plenty of fluids and do plenty of gentle stretching and move the affected muscle through its normal range of motion to prevent stiffness. 3. Follow Up: If your symptoms do not improve please follow up with orthopedics/sports medicine or your PCP for discussion of your diagnoses and further evaluation after today's visit; if you do not have a primary care doctor use the resource guide provided to find one; Please return to the ER for worsening symptoms or other concerns.

## 2019-08-16 NOTE — ED Provider Notes (Signed)
[facility]-EMERGENCY DEPT Provider Note   CSN: [id number] Arrival date & time: 08/16/19  1844     History No chief complaint on file.   Felicia Larsen is a 24 y.o. female.  HPI  Patient is patient is a 24 year old female with a past medical history of headaches presented today with a chief complaint of upper back is left-sided aching.  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  She states it is worse after work.  Is been intermittent for 1 week.  Seems worse with movement and deep breaths.  She denies any trauma to the back denies any fevers, cough, chest pain or shortness of breath.  History without symptoms of urinary or stool retention or incontinence, neurologic changes such as sensation change or weakness lower extremities, coagulopathy or blood thinner use, is not elderly or with history of osteoporosis, denies any history of cancer, fever, IV drug use, weight changes (unexplained), or prolonged steroid use.  Marland Kitchen       Past Medical History:  Diagnosis Date  . Anal fissure   . Chlamydia 12/22/2013  . Constipation   . Headache(784.0)   . Renal calculi   . Renal disorder   . Urinary tract infection    E.Coli resistant to Bactrim    Patient Active Problem List   Diagnosis Date Noted  . Pyelonephritis 05/12/2019  . Migraine with aura 11/03/2017  . Renal disorder   . 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 Microscopic hematuria 03/18/2014  . Raynauds syndrome 02/16/2014  . Slow transit constipation 10/06/2013  . Sleep disturbance 10/09/2012  . Allergic rhinitis 10/09/2012    No past surgical history on file.   OB History   No obstetric history on file.     Family History  Problem Relation Age of Onset  . Kidney Stones Mother   . GI problems Mother   . Hypertension Mother   . Migraines Maternal Grandmother   . Heart disease Maternal Grandmother   . Stroke Maternal Grandmother     . Asthma Sister   . Asthma Brother   . Diabetes Paternal Grandmother   . Migraines Paternal Grandfather   . Asthma Brother   . Allergic rhinitis Brother   . Migraines Maternal Aunt   . Migraines Maternal Uncle     Social History   Tobacco Use  . Smoking status: Never Smoker  . Smokeless tobacco: Never Used  Vaping Use  . Vaping Use: Never used  Substance Use Topics  . Alcohol use: No    Alcohol/week: 0.0 standard drinks  . Drug use: No    Home Medications Prior to Admission medications   Medication Sig Start Date End Date Taking? Authorizing Provider  ciprofloxacin (CIPRO) 250 MG tablet Take 1 tablet (250 mg total) by mouth 2 (two) times daily. 05/12/19   Royal Hawthorn, NP  methocarbamol (ROBAXIN) 500 MG tablet Take 1 tablet (500 mg total) by mouth 2 (two) times daily. 08/16/19   Gailen Shelter, PA    Allergies    Latex  Review of Systems   Review of Systems  Constitutional: Negative for fever.  HENT: Negative for congestion.   Respiratory: Negative for shortness of breath.   Cardiovascular: Negative for chest pain.  Gastrointestinal: Negative for abdominal distention.  Musculoskeletal: Positive for myalgias.  Neurological: Negative for dizziness and headaches.    Physical Exam Updated Vital Signs BP 125/80 (BP Location: Left  Arm)   Pulse 73   Temp 98.5 F (36.9 C) (Oral)   Resp 20   Ht 5\' 6"  (1.676 m)   Wt 61.7 kg   SpO2 100%   BMI 21.95 kg/m   Physical Exam Vitals and nursing note reviewed.  Constitutional:      General: She is not in acute distress. HENT:     Head: Normocephalic and atraumatic.     Nose: Nose normal.  Eyes:     General: No scleral icterus. Cardiovascular:     Rate and Rhythm: Normal rate and regular rhythm.     Pulses: Normal pulses.     Heart sounds: Normal heart sounds.     Comments: Pulses 3+ and symmetric bilateral radius Pulmonary:     Effort: Pulmonary effort is normal. No respiratory distress.     Breath  sounds: No wheezing.  Abdominal:     Palpations: Abdomen is soft.     Tenderness: There is no abdominal tenderness.  Musculoskeletal:       Arms:     Cervical back: Normal range of motion.     Right lower leg: No edema.     Left lower leg: No edema.     Comments: Strength 5/5 in bilateral upper extremities.  Skin:    General: Skin is warm and dry.     Capillary Refill: Capillary refill takes less than 2 seconds.     Comments: No rashes or abrasions.  Neurological:     Mental Status: She is alert. Mental status is at baseline.  Psychiatric:        Mood and Affect: Mood normal.        Behavior: Behavior normal.     ED Results / Procedures / Treatments   Labs (all labs ordered are listed, but only abnormal results are displayed) Labs Reviewed - No data to display  EKG None  Radiology No results found.  Procedures Procedures (including critical care time)  Medications Ordered in ED Medications - No data to display  ED Course  I have reviewed the triage vital signs and the nursing notes.  Pertinent labs & imaging results that were available during my care of the patient were reviewed by me and considered in my medical decision making (see chart for details).    MDM Rules/Calculators/A&P                          Patient presents today with upper back pain.  It is reproducible on exam.  She is 24 years old and has no cardiac or pulmonary diseases.  Physical exam is unremarkable apart from reproducible left-sided parathoracic muscular tenderness.  She is with full range of motion of bilateral arms.  No exertional or pleuritic symptoms.  Broad differential for back pain considered includes malignancy, disc herniation, spinal epidural abscess, spinal fracture, cauda equina, pyelonephritis, kidney stone, AAA, AD, pancreatitis, PE and PTX.   History without symptoms of urinary or stool retention or incontinence, neurologic changes such as sensation change or weakness lower  extremities, coagulopathy or blood thinner use, is not elderly or with history of osteoporosis, denies any history of cancer, fever, IV drug use, weight changes (unexplained), or prolonged steroid use.   Physical exam most consistent with muscular strain. Doubt cauda equina or disc herniation d/t lack of saddle anesthesia/bowel or bladder incontinence or urinary retention, normal gait and reassuring physical examination without neurologic deficits.   History is not supportive of kidney stone,  AAA, AD, pancreatitis, PE or PTX. Patient has no CVA tenderness or urinary sx to suggest pyelonephritis or kidney stone.   Will manage patient conservatively at this time. NSAIDs, back exercises/stretches, heat therapy and follow up with PCP if symptoms do not resolve in 3-4 weeks. Patient offered muscle relaxer for comfort at night. Counseled on need to return to ED for fever, worsening or concerning symptoms. Patient agreeable to plan and states understanding of follow up plans and return precautions.   Patient will follow up with physical therapy/chiropractory, and other outpatient options.  Prescribed muscle relaxer in the form of Robaxin.  To use Tylenol ibuprofen and BenGay/IcyHot  Final Clinical Impression(s) / ED Diagnoses Final diagnoses:  Muscle strain    Rx / DC Orders ED Discharge Orders         Ordered    methocarbamol (ROBAXIN) 500 MG tablet  2 times daily,   Status:  Discontinued     Reprint     08/16/19 2124    methocarbamol (ROBAXIN) 500 MG tablet  2 times daily     Discontinue  Reprint     08/16/19 2125           Gailen Shelter, PA 08/16/19 2150    Sabino Donovan, MD 08/16/19 2223

## 2019-08-18 ENCOUNTER — Ambulatory Visit: Payer: Self-pay | Admitting: Registered Nurse

## 2019-08-18 ENCOUNTER — Telehealth: Payer: Self-pay | Admitting: Family Medicine

## 2019-08-18 NOTE — Telephone Encounter (Signed)
Pt had an appt scheduled today/ unable to  LVM / recording states call cannot be completed at this time / unable to contact pt to let her know appt cancelled due to provider being out office today

## 2019-08-20 ENCOUNTER — Ambulatory Visit (HOSPITAL_COMMUNITY)
Admission: EM | Admit: 2019-08-20 | Discharge: 2019-08-20 | Disposition: A | Discharge status: Home / Self Care | Payer: Medicaid Other | Attending: Family Medicine | Admitting: Family Medicine

## 2019-08-20 ENCOUNTER — Encounter (HOSPITAL_COMMUNITY): Payer: Self-pay

## 2019-08-20 ENCOUNTER — Other Ambulatory Visit: Payer: Self-pay

## 2019-08-20 DIAGNOSIS — Z113 Encounter for screening for infections with a predominantly sexual mode of transmission: Secondary | ICD-10-CM | POA: Insufficient documentation

## 2019-08-20 DIAGNOSIS — N898 Other specified noninflammatory disorders of vagina: Secondary | ICD-10-CM | POA: Insufficient documentation

## 2019-08-20 DIAGNOSIS — R350 Frequency of micturition: Secondary | ICD-10-CM

## 2019-08-20 DIAGNOSIS — Z3202 Encounter for pregnancy test, result negative: Secondary | ICD-10-CM | POA: Diagnosis not present

## 2019-08-20 LAB — POCT URINALYSIS DIP (DEVICE)
Bilirubin Urine: NEGATIVE
Glucose, UA: NEGATIVE mg/dL
Hgb urine dipstick: NEGATIVE
Ketones, ur: NEGATIVE mg/dL
Leukocytes,Ua: NEGATIVE
Nitrite: NEGATIVE
Protein, ur: 30 mg/dL — AB
Specific Gravity, Urine: 1.02 (ref 1.005–1.030)
Urobilinogen, UA: 1 mg/dL (ref 0.0–1.0)
pH: 8.5 — ABNORMAL HIGH (ref 5.0–8.0)

## 2019-08-20 LAB — POC URINE PREG, ED: Preg Test, Ur: NEGATIVE

## 2019-08-20 MED ORDER — METRONIDAZOLE 500 MG PO TABS
500.0000 mg | ORAL_TABLET | Freq: Two times a day (BID) | ORAL | 0 refills | Status: DC
Start: 2019-08-20 — End: 2019-08-20

## 2019-08-20 MED ORDER — METRONIDAZOLE 0.75 % VA GEL: 1.0000 | Freq: Every day | VAGINAL | 0 refills | Status: AC

## 2019-08-20 NOTE — Discharge Instructions (Signed)
Negative pregnancy and no indication of UTI.  We will start treatment for BV pending the results of your vaginal swab.  We will notify of you any positive findings or if any changes to treatment are needed. If normal or otherwise without concern to your results, we will not call you. Please log on to your MyChart to review your results if interested in so.   If symptoms worsen or do not improve in the next week to return to be seen or to follow up with your PCP.

## 2019-08-20 NOTE — ED Provider Notes (Signed)
[facility]    CSN: [id number] Arrival date & time: 08/20/19  1048      History   Chief Complaint Chief Complaint  Patient presents with   Vaginal Discharge   Urinary Frequency   std testing    HPI Felicia Larsen is a 24 y.o. female.   Felicia Larsen presents with complaints of vaginal discharge with odor which has been present for the past week. Also feels she has had some urinary frequency. No pelvic or abdominal pain. No back pain. No fevers. History of BV which felt similar. Sexually active with 1 partner, uses condoms. LMP three weeks ago. Not on birth control. No vaginal itching or rash.     ROS per HPI, negative if not otherwise mentioned.      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 No pertinent surgical history.  OB History   No obstetric history on file.      Home Medications    Prior to Admission medications   Medication Sig Start Date End Date Taking? Authorizing Provider  ciprofloxacin (CIPRO) 250 MG tablet Take 1 tablet (250 mg total) by mouth 2 (two) times daily. 05/12/19   Royal Hawthorn, NP  methocarbamol (ROBAXIN) 500 MG tablet Take 1 tablet (500 mg total) by mouth 2 (two) times daily. 08/16/19   Gailen Shelter, PA  metroNIDAZOLE (METROGEL VAGINAL) 0.75 % vaginal gel Place 1 Applicatorful vaginally at bedtime for 5 days. 08/20/19 08/25/19  Georgetta Haber, NP    Family History Family History  Problem Relation Age of Onset   Kidney Stones Mother    GI problems Mother    Hypertension Mother    Migraines Maternal Grandmother    Heart disease Maternal Grandmother    Stroke Maternal Grandmother    Asthma Sister    Asthma Brother    Diabetes Paternal Grandmother    Migraines Paternal Grandfather    Asthma Brother    Allergic rhinitis Brother    Migraines Maternal Aunt    Migraines Maternal Uncle     Social History Social History   Tobacco Use   Smoking status: Never Smoker   Smokeless tobacco: Never Used  Building services engineer Use: Never used  Substance Use Topics   Alcohol use: No    Alcohol/week: 0.0 standard drinks   Drug use: No     Allergies   Latex   Review of Systems Review of Systems   Physical Exam Triage Vital Signs ED Triage Vitals  Enc Vitals Group     BP 08/20/19 1136 112/68     Pulse Rate 08/20/19 1136 72     Resp 08/20/19 1136 12     Temp 08/20/19 1136 98.6 F (37 C)     Temp src --      SpO2 08/20/19 1136 100 %     Weight --  Height --      Head Circumference --      Peak Flow --      Pain Score 08/20/19 1135 0     Pain Loc --      Pain Edu? --      Excl. in GC? --    No data found.  Updated Vital Signs BP 112/68    Pulse 72    Temp 98.6 F (37 C)    Resp 12    LMP 07/30/2019 (Within Days)    SpO2 100%    Physical Exam Constitutional:      General: She is not in acute distress.    Appearance: She is well-developed.  Cardiovascular:     Rate and Rhythm: Normal rate.  Pulmonary:     Effort: Pulmonary effort is normal.  Abdominal:     Palpations: Abdomen is not rigid.     Tenderness: There is no abdominal tenderness. There is no guarding or rebound.  Genitourinary:    Comments: Denies sores, lesions, vaginal bleeding; no pelvic pain; gu exam deferred at this time, vaginal self swab collected.   Skin:    General: Skin is warm and dry.  Neurological:      Mental Status: She is alert and oriented to person, place, and time.      UC Treatments / Results  Labs (all labs ordered are listed, but only abnormal results are displayed) Labs Reviewed  POCT URINALYSIS DIP (DEVICE) - Abnormal; Notable for the following components:      Result Value   pH 8.5 (*)    Protein, ur 30 (*)    All other components within normal limits  POC URINE PREG, ED  CERVICOVAGINAL ANCILLARY ONLY    EKG   Radiology No results found.  Procedures Procedures (including critical care time)  Medications Ordered in UC Medications - No data to display  Initial Impression / Assessment and Plan / UC Course  I have reviewed the triage vital signs and the nursing notes.  Pertinent labs & imaging results that were available during my care of the patient were reviewed by me and considered in my medical decision making (see chart for details).     Flagyl initiated pending vaginal cytology. Urine without indication of UTI today. Return precautions provided. Patient verbalized understanding and agreeable to plan.   Final Clinical Impressions(s) / UC Diagnoses   Final diagnoses:  Vaginal discharge  Screen for STD (sexually transmitted disease)     Discharge Instructions     Negative pregnancy and no indication of UTI.  We will start treatment for BV pending the results of your vaginal swab.  We will notify of you any positive findings or if any changes to treatment are needed. If normal or otherwise without concern to your results, we will not call you. Please log on to your MyChart to review your results if interested in so.   If symptoms worsen or do not improve in the next week to return to be seen or to follow up with your PCP.     ED Prescriptions    Medication Sig Dispense Auth. Provider   metroNIDAZOLE (FLAGYL) 500 MG tablet  (Status: Discontinued) Take 1 tablet (500 mg total) by mouth 2 (two) times daily for 7 days. 14 tablet Linus Mako B, NP    metroNIDAZOLE (METROGEL VAGINAL) 0.75 % vaginal gel Place 1 Applicatorful vaginally at bedtime for 5 days. 50 g Georgetta Haber, NP     PDMP not  reviewed this encounter.   Georgetta Haber, NP 08/20/19 1204

## 2019-08-20 NOTE — ED Triage Notes (Signed)
Patient thinks she may have BV. Reports urgency, vaginal discharge, and vaginal odor. Also requesting STI testing.

## 2019-08-23 LAB — CERVICOVAGINAL ANCILLARY ONLY
Bacterial Vaginitis (gardnerella): POSITIVE — AB
Candida Glabrata: NEGATIVE
Candida Vaginitis: NEGATIVE
Chlamydia: NEGATIVE
Comment: NEGATIVE
Comment: NEGATIVE
Comment: NEGATIVE
Comment: NEGATIVE
Comment: NEGATIVE
Comment: NORMAL
Neisseria Gonorrhea: NEGATIVE
Trichomonas: NEGATIVE

## 2019-11-15 DIAGNOSIS — B9689 Other specified bacterial agents as the cause of diseases classified elsewhere: Secondary | ICD-10-CM | POA: Diagnosis not present

## 2019-11-15 DIAGNOSIS — N760 Acute vaginitis: Secondary | ICD-10-CM | POA: Diagnosis not present

## 2019-11-15 DIAGNOSIS — N898 Other specified noninflammatory disorders of vagina: Secondary | ICD-10-CM | POA: Diagnosis not present

## 2019-12-03 DIAGNOSIS — J029 Acute pharyngitis, unspecified: Secondary | ICD-10-CM | POA: Diagnosis not present

## 2019-12-03 DIAGNOSIS — N898 Other specified noninflammatory disorders of vagina: Secondary | ICD-10-CM | POA: Diagnosis not present

## 2020-01-31 ENCOUNTER — Other Ambulatory Visit: Payer: Medicaid Other

## 2020-02-03 ENCOUNTER — Other Ambulatory Visit (HOSPITAL_COMMUNITY)
Admission: RE | Admit: 2020-02-03 | Discharge: 2020-02-03 | Disposition: A | Discharge status: Home / Self Care | Payer: Medicaid Other | Source: Ambulatory Visit | Attending: Pediatrics | Admitting: Pediatrics

## 2020-02-03 ENCOUNTER — Encounter: Payer: Self-pay | Admitting: Pediatrics

## 2020-02-03 ENCOUNTER — Ambulatory Visit (INDEPENDENT_AMBULATORY_CARE_PROVIDER_SITE_OTHER): Payer: Medicaid Other | Admitting: Pediatrics

## 2020-02-03 ENCOUNTER — Other Ambulatory Visit: Payer: Self-pay

## 2020-02-03 VITALS — BP 110/74 | HR 113 | Ht 66.14 in | Wt 135.2 lb

## 2020-02-03 DIAGNOSIS — Z113 Encounter for screening for infections with a predominantly sexual mode of transmission: Secondary | ICD-10-CM | POA: Diagnosis not present

## 2020-02-03 DIAGNOSIS — Z3202 Encounter for pregnancy test, result negative: Secondary | ICD-10-CM

## 2020-02-03 DIAGNOSIS — N898 Other specified noninflammatory disorders of vagina: Secondary | ICD-10-CM | POA: Diagnosis not present

## 2020-02-03 LAB — POCT URINE PREGNANCY: Preg Test, Ur: NEGATIVE

## 2020-02-03 NOTE — Progress Notes (Signed)
History was provided by the patient.  Felicia Larsen is a 25 y.o. female who is here for irritation "down there".  PCP- Previously Lezlie Lye, Meda Coffee, MD but no longer seeing her   HPI:  Pt reports that she has had some vaginal irritation for the last few weeks. No rashes or lesions. No vaginal pain or burning. Having a little discharge but no more than normal- thin, with no color and no odor. No bleeding. LMP ~ Dec 20th. She did have some abdominal pain ~ 1 week ago that lasted 1 day. It was located on left high near vagina. She took ibuprofen and it went away. No fever. No sore throat. Had a severe migraine ~ 3 days ago with associated nausea and "woozy" feelings. She took ibuprofen and it got better.  Sexually active "occationally" with ex and last had sex over 1 month ago. No pain associated. Periods are not consistently on the same day but she has had monthly cycles since stopping birth control. Stopped BC about 2 years ago. Since stopping menses have been a lot heavier and goes through diapers ~ 4 per day and cycle lasts 3-4 days. Associated with severe dysmenorrhea that she manages with heating and ibuprofen PRN. Prefers being off [facility] because she experienced side effects- hair long, acne, mood changes, prefers being off.  She states that she had BV about a month or 2 ago that was treated by urgent care. Per care everywhere she was treated for BV in July 2021 and again a few weeks prior to when she was seen for vaginal discharge and sore throat in November 2021. She received treatment for Chlamydia in November 2021 by urgent care and remaining tests resulted negative for Candida, Gardnerella, Trich, Gonorrhea, and GAS.  ROS- pertinent ROS in HPI; still struggling with constipation   Patient Active Problem List   Diagnosis Date Noted  . Pyelonephritis 05/12/2019  . Migraine with aura 11/03/2017  . Renal disorder   . 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  Microscopic hematuria 03/18/2014  . Raynauds syndrome 02/16/2014  . Slow transit constipation 10/06/2013  . Sleep disturbance 10/09/2012  . Allergic rhinitis 10/09/2012    Current Outpatient Medications on File Prior to Visit  Medication Sig Dispense Refill  . ciprofloxacin (CIPRO) 250 MG tablet Take 1 tablet (250 mg total) by mouth 2 (two) times daily. (Patient not taking: Reported on 02/03/2020) 6 tablet 0  . methocarbamol (ROBAXIN) 500 MG tablet Take 1 tablet (500 mg total) by mouth 2 (two) times daily. (Patient not taking: Reported on 02/03/2020) 20 tablet 0   No current facility-administered medications on file prior to visit.    Allergies  Allergen Reactions  . Latex Itching and Rash    IRRITATION    Social History: Sexually active? yes - inconsistently, last in ~ November 2021   Last STI Screening: November 2021 with urgent care  Pregnancy Prevention: condoms   Physical Exam:    Vitals:   02/03/20 0931  BP: 110/74  Pulse: (!) 113  Weight: 135 lb 3.2 oz (61.3 kg)  Height: 5' 6.14" (1.68 m)   Growth percentile SmartLinks can only be used for patients less than 50 years old.  Physical Exam Constitutional:      General: She is not in acute distress.    Appearance: Normal appearance. She is normal weight. She is not ill-appearing.  HENT:     Nose: Nose normal.     Mouth/Throat:  Mouth: Mucous membranes are moist.  Eyes:     Conjunctiva/sclera: Conjunctivae normal.     Pupils: Pupils are equal, round, and reactive to light.  Cardiovascular:     Rate and Rhythm: Normal rate and regular rhythm.     Pulses: Normal pulses.     Heart sounds: Normal heart sounds.  Pulmonary:     Effort: Pulmonary effort is normal.     Breath sounds: Normal breath sounds.  Abdominal:     General: Abdomen is flat. Bowel sounds are normal. There is no distension.     Palpations: Abdomen is soft. There is no mass.     Tenderness: There is abdominal tenderness (RLQ). There is no  guarding or rebound.  Genitourinary:    General: Normal vulva.     Vagina: No vaginal discharge.  Musculoskeletal:     Cervical back: Neck supple.  Skin:    General: Skin is warm and dry.     Capillary Refill: Capillary refill takes less than 2 seconds.  Neurological:     General: No focal deficit present.     Mental Status: She is alert.    Assessment/Plan: Felicia CAMPOY is a 25 y.o. female here for evaluation of 1-2 weeks of vaginal irritation.   1. Vaginal irritation - She wants BV and STD testing. She has not been sexually active since ~ Nov 2021. No bleeding or change in vaginal discharge. No odor, fever, or persistent abdominal pain. Tender to plapation in RLQ. No guarding or rigidity on exam. Patient refuses pelvic exam today but is ok with external exam which is normal. No reported abnormal bleeding or pain with sex. Provided information for adult provider that accept medicaid for ongoing management of migraines and routine follow up. Discussed vaginal hygiene and recommended against NutraBlast Boric Acid Vaginal Suppositories that patient found on amazon. Will call with results and treat as indicated. - WET PREP BY MOLECULAR PROBE  2. Routine screening for STI (sexually transmitted infection) - Urine cytology ancillary only  3. Pregnancy examination or test, negative result - POCT urine pregnancy   Creola Corn, DO [facility], PGY-3 02/03/2020 10:34 AM

## 2020-02-03 NOTE — Patient Instructions (Addendum)
We will contact you with your results.  I have included a list of adult providers.  Please follow the instructions for proper vaginal hygiene   Adult Primary Care Clinics Name Criteria Services   [facility]  Address: [address] [place], [place] [address]  Phone: [phone] Hours: Monday - Friday 9 AM -6 PM  Types of insurance accepted:  Marland Kitchen Nurse, learning disability . [facility] (orange card) . Medicaid . Medicare . Uninsured  Language services:  Marland Kitchen Video and phone interpreters available   Ages 62 and older    . Adult primary care . Onsite pharmacy . Integrated behavioral health . Financial assistance counseling . Walk-in hours for established patients  Financial assistance counseling hours: Tuesdays 2:00PM - 5:00PM  Thursday 8:30AM - 4:30PM  Space is limited, 10 on Tuesday and 20 on Thursday. It's on first come first serve basis  Name Criteria Services   [facility]  Address: [address] [place], [place] [address]  Phone: [phone]  Hours: Monday - Friday 8:30 AM - 5 PM  Types of insurance accepted:  Marland Kitchen Nurse, learning disability . Medicaid . Medicare . Uninsured  Language services:  Marland Kitchen Video and phone interpreters available   All ages - newborn to adult   . Primary care for all ages (children and adults) . Integrated behavioral health . Nutritionist . Financial assistance counseling   Name Criteria Services   [facility]  Located on the ground floor of [facility]  Address: [address]  [place],  [place]  [address]  Phone: [phone]  Hours: Monday - Friday 8:15 AM - 5 PM  Types of insurance accepted:  Marland Kitchen Nurse, learning disability . Medicaid . Medicare . Uninsured  Language services:  Marland Kitchen Video and phone interpreters available   Ages 36 and older   . Adult primary care . Nutritionist . Certified Diabetes Educator  . Integrated  behavioral health . Financial assistance counseling   Name Criteria Services   [facility]  Address: [address] [place], [place] [address]  Phone: [phone]  Hours: Monday - Friday 8:30 AM - 5 PM    Types of insurance accepted:  Marland Kitchen Nurse, learning disability . Medicaid . Medicare . Uninsured  Language services:  Marland Kitchen Video and phone interpreters available   All ages - newborn to adult   . Primary care for all ages (children and adults) . Integrated behavioral health . Financial assistance counseling    Healthy vaginal hygiene practices   -  Avoid sleeper pajamas. Nightgowns allow air to circulate.  Sleep without underpants whenever possible.  -  Wear cotton underpants during the day. Double-rinse underwear after washing to avoid residual irritants. Do not use fabric softeners for underwear and swimsuits.  - Avoid tights, leotards, leggings, "skinny" jeans, and other tight-fitting clothing. Skirts and loose-fitting pants allow air to circulate.  - Avoid pantyliners.  Instead use tampons or cotton pads.  - Daily warm bathing is helpful:     - Soak in clean water (no soap) for 10 to 15 minutes. Adding vinegar or baking soda to the water has not been specifically studied and may not be better than clean water alone.      - Use soap to wash regions other than the genital area just before getting out of the tub. Limit use of any soap on genital areas. Use fragance-free soaps.     - Rinse  the genital area well and gently pat dry.  Don't rub.  Hair dryer to assist with drying can be used only if on cool setting.     - Do not use bubble baths or perfumed soaps.  - Do not use any feminine sprays, douches or powders.  These contain chemicals that will irritate the skin.  - If the genital area is tender or swollen, cool compresses may relieve the discomfort. Unscented wet wipes can be used instead of toilet paper for wiping.   - Emollients, such as  Vaseline, may help protect skin and can be applied to the irritated area.  - Always remember to wipe front-to-back after bowel movements. Pat dry after urination.  - Do not sit in wet swimsuits for long periods of time after swimming

## 2020-02-04 LAB — WET PREP BY MOLECULAR PROBE
Candida species: NOT DETECTED
MICRO NUMBER:: 11389975
SPECIMEN QUALITY:: ADEQUATE
Trichomonas vaginosis: NOT DETECTED

## 2020-02-06 LAB — URINE CYTOLOGY ANCILLARY ONLY
Bacterial Vaginitis-Urine: NEGATIVE
Candida Urine: NEGATIVE
Chlamydia: NEGATIVE
Comment: NEGATIVE
Comment: NEGATIVE
Comment: NORMAL
Neisseria Gonorrhea: NEGATIVE
Trichomonas: NEGATIVE

## 2020-02-07 ENCOUNTER — Other Ambulatory Visit: Payer: Self-pay | Admitting: Pediatrics

## 2020-02-07 DIAGNOSIS — B9689 Other specified bacterial agents as the cause of diseases classified elsewhere: Secondary | ICD-10-CM

## 2020-02-07 MED ORDER — METRONIDAZOLE 500 MG PO TABS: 500.0000 mg | ORAL_TABLET | Freq: Two times a day (BID) | ORAL | 0 refills | Status: AC

## 2020-02-14 NOTE — Progress Notes (Signed)
I have reviewed the resident's note and plan of care and helped develop the plan as necessary.  Needs transition to adult medicine. Providers recommended.   Alfonso Ramus, FNP

## 2020-03-27 DIAGNOSIS — N760 Acute vaginitis: Secondary | ICD-10-CM | POA: Diagnosis not present

## 2020-03-27 DIAGNOSIS — Z113 Encounter for screening for infections with a predominantly sexual mode of transmission: Secondary | ICD-10-CM | POA: Diagnosis not present

## 2020-05-24 DIAGNOSIS — N898 Other specified noninflammatory disorders of vagina: Secondary | ICD-10-CM | POA: Diagnosis not present

## 2020-06-28 DIAGNOSIS — Z202 Contact with and (suspected) exposure to infections with a predominantly sexual mode of transmission: Secondary | ICD-10-CM | POA: Diagnosis not present

## 2020-08-09 DIAGNOSIS — Z202 Contact with and (suspected) exposure to infections with a predominantly sexual mode of transmission: Secondary | ICD-10-CM | POA: Diagnosis not present

## 2020-08-09 DIAGNOSIS — K649 Unspecified hemorrhoids: Secondary | ICD-10-CM | POA: Diagnosis not present

## 2020-08-09 DIAGNOSIS — L700 Acne vulgaris: Secondary | ICD-10-CM | POA: Diagnosis not present

## 2020-09-14 DIAGNOSIS — L700 Acne vulgaris: Secondary | ICD-10-CM | POA: Diagnosis not present

## 2020-10-04 DIAGNOSIS — K5904 Chronic idiopathic constipation: Secondary | ICD-10-CM | POA: Diagnosis not present

## 2020-10-04 DIAGNOSIS — R300 Dysuria: Secondary | ICD-10-CM | POA: Diagnosis not present

## 2020-10-04 DIAGNOSIS — K649 Unspecified hemorrhoids: Secondary | ICD-10-CM

## 2020-10-04 DIAGNOSIS — N898 Other specified noninflammatory disorders of vagina: Secondary | ICD-10-CM | POA: Diagnosis not present

## 2020-10-04 DIAGNOSIS — R1032 Left lower quadrant pain: Secondary | ICD-10-CM | POA: Diagnosis not present

## 2020-10-04 HISTORY — DX: Unspecified hemorrhoids: K64.9

## 2020-10-04 HISTORY — DX: Chronic idiopathic constipation: K59.04

## 2020-10-11 DIAGNOSIS — N898 Other specified noninflammatory disorders of vagina: Secondary | ICD-10-CM | POA: Diagnosis not present

## 2020-10-11 DIAGNOSIS — B373 Candidiasis of vulva and vagina: Secondary | ICD-10-CM | POA: Diagnosis not present

## 2020-12-14 DIAGNOSIS — N898 Other specified noninflammatory disorders of vagina: Secondary | ICD-10-CM | POA: Diagnosis not present

## 2020-12-14 DIAGNOSIS — R399 Unspecified symptoms and signs involving the genitourinary system: Secondary | ICD-10-CM | POA: Diagnosis not present

## 2020-12-28 DIAGNOSIS — N898 Other specified noninflammatory disorders of vagina: Secondary | ICD-10-CM | POA: Diagnosis not present

## 2020-12-31 ENCOUNTER — Ambulatory Visit (HOSPITAL_COMMUNITY)
Admission: EM | Admit: 2020-12-31 | Discharge: 2020-12-31 | Discharge status: Against Medical Advice | Payer: Medicaid Other

## 2020-12-31 ENCOUNTER — Other Ambulatory Visit: Payer: Self-pay

## 2020-12-31 DIAGNOSIS — Z5321 Procedure and treatment not carried out due to patient leaving prior to being seen by health care provider: Secondary | ICD-10-CM | POA: Diagnosis not present

## 2021-01-02 ENCOUNTER — Encounter (HOSPITAL_COMMUNITY): Payer: Self-pay

## 2021-01-02 ENCOUNTER — Ambulatory Visit (HOSPITAL_COMMUNITY)
Admission: EM | Admit: 2021-01-02 | Discharge: 2021-01-02 | Disposition: A | Discharge status: Home / Self Care | Payer: Medicaid Other | Attending: Emergency Medicine | Admitting: Emergency Medicine

## 2021-01-02 ENCOUNTER — Other Ambulatory Visit: Payer: Self-pay

## 2021-01-02 VITALS — BP 111/54 | HR 64 | Temp 97.7°F | Resp 18 | Ht 66.0 in | Wt 132.0 lb

## 2021-01-02 DIAGNOSIS — B009 Herpesviral infection, unspecified: Secondary | ICD-10-CM

## 2021-01-02 MED ORDER — VALACYCLOVIR HCL 1 G PO TABS
1000.0000 mg | ORAL_TABLET | Freq: Three times a day (TID) | ORAL | 0 refills | Status: AC
Start: 2021-01-02 — End: 2021-01-16

## 2021-01-02 NOTE — ED Triage Notes (Signed)
Pt c/o herpes outbreak on the genitals x3weeks. Pt has taken no medication for the outbreak. This is the second outbreak. Pt denies getting undressed for the provider.

## 2021-01-02 NOTE — ED Provider Notes (Signed)
[facility]    CSN: [id number] Arrival date & time: 01/02/21  0809      History   Chief Complaint Chief Complaint  Patient presents with   Herpes    HPI Felicia Larsen is a 25 y.o. female.   Patient presents with blisters, dysuria, vaginal itching and irritation for 3 weeks.  Endorses thick cottage cheese discharge which is sometimes clear. Denies abdominal pain, flank pain, hematuria, urinary frequency or urgency, fever, chills..  Sexually active, 1 partner, condom use. Last encounter 3 weeks ago.  Endorses that she was seen in the in office twice in the last 2 weeks, testing negative, endorses that provider instructed patient to still take BV antibiotic for prophylactic treatment, patient endorses that she did not do this.  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 No pertinent surgical history.  OB History   No obstetric history on file.      Home Medications    Prior to Admission medications   Not on File    Family History Family History  Problem Relation Age of Onset   Kidney Stones Mother    GI problems Mother    Hypertension Mother    Migraines Maternal Grandmother    Heart disease Maternal Grandmother    Stroke Maternal Grandmother    Asthma Sister    Asthma Brother    Diabetes Paternal Grandmother    Migraines Paternal Grandfather    Asthma Brother    Allergic rhinitis Brother     Migraines Maternal Aunt    Migraines Maternal Uncle     Social History Social History   Tobacco Use   Smoking status: Never   Smokeless tobacco: Never  Vaping Use   Vaping Use: Never used  Substance Use Topics   Alcohol use: No    Alcohol/week: 0.0 standard drinks   Drug use: No     Allergies   Latex   Review of Systems Review of Systems  Constitutional: Negative.   Respiratory: Negative.    Cardiovascular: Negative.   Skin: Negative.   Neurological: Negative.     Physical Exam Triage Vital Signs ED Triage Vitals  Enc Vitals Group     BP 01/02/21 0842 (!) 111/54     Pulse Rate 01/02/21 0842 64     Resp 01/02/21 0842 18     Temp 01/02/21 0842 97.7 F (36.5 C)     Temp Source 01/02/21 0842 Oral     SpO2 01/02/21 0842 100 %     Weight 01/02/21 0841 132 lb (59.9 kg)     Height 01/02/21 0841 5\' 6"  (1.676 m)     Head Circumference --      Peak Flow --      Pain Score 01/02/21 0840 2     Pain Loc --      Pain Edu? --  Excl. in GC? --    No data found.  Updated Vital Signs BP (!) 111/54 (BP Location: Left Arm)   Pulse 64   Temp 97.7 F (36.5 C) (Oral)   Resp 18   Ht 5\' 6"  (1.676 m)   Wt 132 lb (59.9 kg)   SpO2 100%   BMI 21.31 kg/m   Visual Acuity Right Eye Distance:   Left Eye Distance:   Bilateral Distance:    Right Eye Near:   Left Eye Near:    Bilateral Near:     Physical Exam Constitutional:      Appearance: Normal appearance. She is normal weight.  HENT:     Head: Normocephalic.  Eyes:     Extraocular Movements: Extraocular movements intact.  Pulmonary:     Effort: Pulmonary effort is normal.  Genitourinary:    Comments: deferred Skin:    General: Skin is warm and dry.  Neurological:     Mental Status: She is alert and oriented to person, place, and time. Mental status is at baseline.  Psychiatric:        Mood and Affect: Mood normal.        Behavior: Behavior normal.     UC Treatments / Results  Labs (all labs  ordered are listed, but only abnormal results are displayed) Labs Reviewed - No data to display  EKG   Radiology No results found.  Procedures Procedures (including critical care time)  Medications Ordered in UC Medications - No data to display  Initial Impression / Assessment and Plan / UC Course  I have reviewed the triage vital signs and the nursing notes.  Pertinent labs & imaging results that were available during my care of the patient were reviewed by me and considered in my medical decision making (see chart for details).  Herpes simplex  Reviewed recent testing completed in office 1 week ago, STI testing negative, discussed with patient that symptoms specifically vaginal discharge sounds consistent with yeast or bacterial vaginosis, recommended that if treatment for herpes simplex is ineffective to attempt use of antibiotic and follow-up with primary care doctor  1.  Valacyclovir 1000 mg 3 times daily for 7 days  Final Clinical Impressions(s) / UC Diagnoses   Final diagnoses:  None   Discharge Instructions   None    ED Prescriptions   None    PDMP not reviewed this encounter.   , Valinda Hoar 01/02/21 [phone]

## 2021-01-02 NOTE — Discharge Instructions (Signed)
Take valacyclovir three times a day for 7 days, you will have medicine left over, can use if another outbreak occurs   May follow up as needed

## 2021-01-05 ENCOUNTER — Emergency Department (HOSPITAL_COMMUNITY)
Admission: EM | Admit: 2021-01-05 | Discharge: 2021-01-05 | Disposition: A | Discharge status: Home / Self Care | Payer: Medicaid Other | Attending: Emergency Medicine | Admitting: Emergency Medicine

## 2021-01-05 ENCOUNTER — Emergency Department (HOSPITAL_COMMUNITY): Payer: Medicaid Other

## 2021-01-05 ENCOUNTER — Other Ambulatory Visit: Payer: Self-pay

## 2021-01-05 ENCOUNTER — Encounter (HOSPITAL_COMMUNITY): Payer: Self-pay

## 2021-01-05 DIAGNOSIS — Y9389 Activity, other specified: Secondary | ICD-10-CM | POA: Diagnosis not present

## 2021-01-05 DIAGNOSIS — S3992XA Unspecified injury of lower back, initial encounter: Secondary | ICD-10-CM | POA: Diagnosis not present

## 2021-01-05 DIAGNOSIS — S0990XA Unspecified injury of head, initial encounter: Secondary | ICD-10-CM | POA: Diagnosis not present

## 2021-01-05 DIAGNOSIS — Y92410 Unspecified street and highway as the place of occurrence of the external cause: Secondary | ICD-10-CM | POA: Diagnosis not present

## 2021-01-05 DIAGNOSIS — Z5321 Procedure and treatment not carried out due to patient leaving prior to being seen by health care provider: Secondary | ICD-10-CM | POA: Diagnosis not present

## 2021-01-05 DIAGNOSIS — S299XXA Unspecified injury of thorax, initial encounter: Secondary | ICD-10-CM | POA: Insufficient documentation

## 2021-01-05 NOTE — ED Notes (Signed)
PT reports she is leaving. States she does not want to wait any longer.
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
## 2021-01-08 ENCOUNTER — Telehealth: Payer: Self-pay

## 2021-01-08 DIAGNOSIS — Z9189 Other specified personal risk factors, not elsewhere classified: Secondary | ICD-10-CM

## 2021-01-08 NOTE — Telephone Encounter (Signed)
Transition Care Management Follow-up Telephone Call Date of discharge and from where: 01/05/2021-[place] How have you been since you were released from the hospital? [facility]-patient stated she is taking tylenol but will probably go to urgent care. [facility] but would like assistance to get a PCP with [facility].  Any questions or concerns? No

## 2021-02-02 ENCOUNTER — Other Ambulatory Visit: Payer: Self-pay

## 2021-02-02 NOTE — Patient Instructions (Signed)
Visit Information  Ms. Yaslyn Cumby Goodell  - as a part of your Medicaid benefit, you are eligible for care management and care coordination services at no cost or copay. I was unable to reach you by phone today but would be happy to help you with your health related needs. Please feel free to call me @ [phone].   A member of the Managed Medicaid care management team will reach out to you again over the next 7 days.   Gus Puma, BSW, [facility]   [employer] Risk Managed Medicaid Team  [phone]

## 2021-02-02 NOTE — Patient Outreach (Signed)
Care Coordination  02/02/2021  Felicia Larsen 03/13/1995 [id number]   Medicaid Managed Care   Unsuccessful Outreach Note  02/02/2021 Name: Felicia Larsen MRN: [id number] DOB: March 06, 1995  Referred by: Lezlie Lye, Meda Coffee, MD Reason for referral : High Risk Managed Medicaid (MM Social Work Unsuccessful Telephone outreach)   An unsuccessful telephone outreach was attempted today. The patient was referred to the case management team for assistance with care management and care coordination.   Follow Up Plan: The care management team will reach out to the patient again over the next 7 days.   Gus Puma, BSW, [facility]   [employer] Risk Managed Medicaid Team  [phone]

## 2021-02-13 ENCOUNTER — Other Ambulatory Visit: Payer: Self-pay

## 2021-02-13 NOTE — Patient Outreach (Signed)
°  Medicaid Managed Care Social Work Note  02/13/2021 Name:  Felicia Larsen MRN:  [id number] DOB:  01/07/1996  Felicia Larsen is an 26 y.o. year old female who is a primary patient of Jacelyn Pi, Lilia Argue, MD.  The [facility] Managed Care Coordination team was consulted for assistance with:   PCP  Ms. Tarolli was given information about Medicaid Managed Care Coordination team services today. Jessina Marse Larsen Patient agreed to services and verbal consent obtained.  Engaged with patient  for by telephone forinitial visit in response to referral for case management and/or care coordination services.   Assessments/Interventions:  Review of past medical history, allergies, medications, health status, including review of consultants reports, laboratory and other test data, was performed as part of comprehensive evaluation and provision of chronic care management services.  SDOH: (Social Determinant of Health) assessments and interventions performed: BSW received a referral for patient to assist with a new PCP. BSW spoke with patient and she stated she had a PCP but she retired. BSW informed patient about the Patient [place] and provided the information for [facility] provider search. Patient stated she would use the provider search. No other resources are needed at this time.  Advanced Directives Status:  Not addressed in this encounter.  Care Plan                 Allergies  Allergen Reactions   Latex Itching and Rash    IRRITATION    Medications Reviewed Today     Reviewed by Hans Eden, NP (Nurse Practitioner) on 01/02/21 at [phone]  Med List Status: <None>   Medication Order Taking? 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   Follow up:  Patient agrees to Care Plan and Follow-up.  Plan: The Managed Medicaid care management team will reach out to the patient again over the next 30 days.  Date/time of next scheduled Social Work care management/care coordination outreach:  03/16/21  Mickel Fuchs, Arita Miss, [place] Medicaid Team  [phone]

## 2021-02-13 NOTE — Patient Instructions (Signed)
Visit Information  Ms. Mccomber was given information about Medicaid Managed Care team care coordination services as a part of their [employer] Medicaid benefit. Melodi Happel Goodell verbally consented to engagement with the [facility] Managed Care team.   If you are experiencing a medical emergency, please call 911 or report to your local emergency department or urgent care.   If you have a non-emergency medical problem during routine business hours, please contact your provider's office and ask to speak with a nurse.   For questions related to your Healthy [facility] health plan, please call: [phone] or visit the homepage here: [email or web address]  If you would like to schedule transportation through your Healthy [facility] plan, please call the following number at least 2 days in advance of your appointment: [phone]  Call the [facility] Crisis Line at [phone], at any time, 24 hours a day, 7 days a week. If you are in danger or need immediate medical attention call 911.  If you would like help to quit smoking, call 1-800-QUIT-NOW (561-653-1140) OR Espaol: 1-855-Djelo-Ya (3-875-643-3295) o para ms informacin haga clic aqu or Text READY to 188-416 to register via text  Ms. Settlemyre - following are the goals we discussed in your visit today:   Goals Addressed   None      Social Worker will follow up in 30 days .   Gus Puma, BSW, [facility]   [place]  High Risk Managed Medicaid Team  [phone]   Following is a copy of your plan of care:  There are no care plans that you recently modified to display for this patient.

## 2021-03-16 ENCOUNTER — Other Ambulatory Visit: Payer: Self-pay

## 2021-03-16 NOTE — Patient Instructions (Signed)
Visit Information  Ms. Dirocco was given information about Medicaid Managed Care team care coordination services as a part of their [employer] Medicaid benefit. Syniyah Bourne Goodell verbally consented to engagement with the [facility] Managed Care team.   If you are experiencing a medical emergency, please call 911 or report to your local emergency department or urgent care.   If you have a non-emergency medical problem during routine business hours, please contact your provider's office and ask to speak with a nurse.   For questions related to your Healthy [facility] health plan, please call: [phone] or visit the homepage here: [email or web address]  If you would like to schedule transportation through your Healthy [facility] plan, please call the following number at least 2 days in advance of your appointment: [phone]  For information about your ride after you set it up, call Ride Assist at [phone]. Use this number to activate a Will Call pickup, or if your transportation is late for a scheduled pickup. Use this number, too, if you need to make a change or cancel a previously scheduled reservation.  If you need transportation services right away, call [phone]. The after-hours call center is staffed 24 hours to handle ride assistance and urgent reservation requests (including discharges) 365 days a year. Urgent trips include sick visits, hospital discharge requests and life-sustaining treatment.  Call the [facility] Line at [phone], at any time, 24 hours a day, 7 days a week. If you are in danger or need immediate medical attention call 911.  If you would like help to quit smoking, call 1-800-QUIT-NOW (567-152-7401) OR Espaol: 1-855-Djelo-Ya (3-300-762-2633) o para ms informacin haga clic aqu or Text READY to 354-562 to register via text  Ms. Hilda Blades - following are the goals we discussed in your visit today:    Goals Addressed   None     The  Patient                                              has been provided with contact information for the Managed Medicaid care management team and has been advised to call with any health related questions or concerns.   Gus Puma, BSW, [facility]   [place]  High Risk Managed Medicaid Team  [phone]   Following is a copy of your plan of care:  There are no care plans that you recently modified to display for this patient.

## 2021-03-16 NOTE — Patient Outreach (Signed)
°  Medicaid Managed Care Social Work Note  03/16/2021 Name:  Felicia Larsen MRN:  [id number] DOB:  1995/11/29  Felicia Larsen is an 26 y.o. year old female who is a primary patient of Jacelyn Pi, Lilia Argue, MD.  The [facility] Managed Care Coordination team was consulted for assistance with:   PCP  Ms. Hyppolite was given information about Medicaid Managed Care Coordination team services today. Felicia Larsen Patient agreed to services and verbal consent obtained.  Engaged with patient  for by telephone forfollow up visit in response to referral for case management and/or care coordination services.   Assessments/Interventions:  Review of past medical history, allergies, medications, health status, including review of consultants reports, laboratory and other test data, was performed as part of comprehensive evaluation and provision of chronic care management services.  SDOH: (Social Determinant of Health) assessments and interventions performed: BSW completed follow up with patient. She has not made an appointment with PCP yet due to being busy. Patient states she still has all of her information. No other resources are needed at this time.  Advanced Directives Status:  Not addressed in this encounter.  Care Plan                 Allergies  Allergen Reactions   Latex Itching and Rash    IRRITATION    Medications Reviewed Today     Reviewed by Hans Eden, NP (Nurse Practitioner) on 01/02/21 at [phone]  Med List Status: <None>   Medication Order Taking? 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   Follow up:  Patient agrees to Care Plan and Follow-up.  Plan: The  Patient has been provided with contact information for the Managed Medicaid care management team and has been advised to call with any health related questions or concerns.    Mickel Fuchs, BSW, [place] Managed Medicaid Team  [phone]

## 2021-04-01 ENCOUNTER — Ambulatory Visit (HOSPITAL_COMMUNITY): Payer: Medicaid Other

## 2021-04-10 DIAGNOSIS — N760 Acute vaginitis: Secondary | ICD-10-CM | POA: Diagnosis not present

## 2021-06-20 ENCOUNTER — Telehealth: Payer: Self-pay | Admitting: Emergency Medicine

## 2021-06-20 ENCOUNTER — Ambulatory Visit
Admission: RE | Admit: 2021-06-20 | Discharge: 2021-06-20 | Disposition: A | Discharge status: Home / Self Care | Payer: Medicaid Other | Source: Ambulatory Visit | Attending: Emergency Medicine | Admitting: Emergency Medicine

## 2021-06-20 VITALS — BP 124/81 | HR 68 | Temp 98.6°F | Resp 20

## 2021-06-20 DIAGNOSIS — Z3202 Encounter for pregnancy test, result negative: Secondary | ICD-10-CM

## 2021-06-20 DIAGNOSIS — Z202 Contact with and (suspected) exposure to infections with a predominantly sexual mode of transmission: Secondary | ICD-10-CM | POA: Insufficient documentation

## 2021-06-20 LAB — POCT URINALYSIS DIP (MANUAL ENTRY)
Bilirubin, UA: NEGATIVE
Blood, UA: NEGATIVE
Glucose, UA: NEGATIVE mg/dL
Ketones, POC UA: NEGATIVE mg/dL
Leukocytes, UA: NEGATIVE
Nitrite, UA: NEGATIVE
Protein Ur, POC: NEGATIVE mg/dL
Spec Grav, UA: 1.025 (ref 1.010–1.025)
Urobilinogen, UA: 0.2 E.U./dL
pH, UA: 7.5 (ref 5.0–8.0)

## 2021-06-20 LAB — POCT URINE PREGNANCY: Preg Test, Ur: NEGATIVE

## 2021-06-20 MED ORDER — DOXYCYCLINE HYCLATE 100 MG PO CAPS: 100.0000 mg | ORAL_CAPSULE | Freq: Two times a day (BID) | ORAL | 0 refills | Status: AC

## 2021-06-20 MED ORDER — DOXYCYCLINE HYCLATE 100 MG PO CAPS: 100.0000 mg | ORAL_CAPSULE | Freq: Two times a day (BID) | ORAL | 0 refills | Status: DC

## 2021-06-20 NOTE — Telephone Encounter (Signed)
Patient requested prescription sent to different pharmacy °

## 2021-06-20 NOTE — Discharge Instructions (Addendum)
The results of your STD testing today will be made available to you once they are complete, this typically takes 3 to 5 days.  They will initially be posted to your MyChart and, if any of your results are abnormal, you will receive a phone call with those results along with further instructions regarding treatment and any prescriptions, if needed.    Based on the history that you provided to me today, you were treated empirically for chlamydia with a prescription for doxycycline, 1 tablet twice daily for the next 7 days.  Please take all tablets as prescribed, do not skip doses.  Failure to take all doses as prescribed can result in a worsening infection that will be more difficult to treat and resolve.  Please abstain from sexual intercourse for 7 days.   Your urine pregnancy test today is negative.   Please remember that the only way to prevent transmission of sexually transmitted disease when having sexual intercourse is to use condoms.  Repeat sexually transmitted infections can cause scarring in your fallopian tubes which will interfere with your ability to conceive later in life.  Repeat exposures to sexually transmitted diseases can also increase your risk of human papilloma virus which causes cervical cancer and genital warts.   If you have not had complete resolution of your symptoms after completing treatment, please return for repeat evaluation.   Thank you for visiting urgent care today.  I appreciate the opportunity to participate in your care.

## 2021-06-20 NOTE — ED Provider Notes (Signed)
[facility]    CSN: [id number] Arrival date & time: 06/20/21  2094    HISTORY   Chief Complaint  Patient presents with   Possible Pregnancy   Exposure to STD   HPI Felicia Larsen is a 26 y.o. female. Pt presents to urgent care reporting exposure to chlamydia after having oral sex with her partner.  Patient states they also had vaginal intercourse without using a condom.  Patient states she would like to be tested for chlamydia, treated for chlamydia and to be tested for pregnancy.  Patient politely declines offer for testing for HIV and syphilis due to not wanting to have blood drawn.  Patient politely declines oral pharyngeal STD swab.  Patient denies vaginal discharge, sore throat, vaginal pruritus, genital lesions, pelvic pain or pressure.  Urine pregnancy test today is negative.  The history is provided by the patient.  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 No pertinent surgical history. OB History   No obstetric history on file.    Home Medications    Prior to Admission medications   Medication Sig Start Date End Date Taking? Authorizing Provider  doxycycline (VIBRAMYCIN) 100 MG capsule Take 1 capsule (100 mg total) by mouth 2 (two) times daily for 7 days. 06/20/21 06/27/21 Yes Theadora Rama Scales, PA-C   Family History Family History  Problem Relation Age of Onset   Kidney Stones Mother    GI problems  Mother    Hypertension Mother    Migraines Maternal Grandmother    Heart disease Maternal Grandmother    Stroke Maternal Grandmother    Asthma Sister    Asthma Brother    Diabetes Paternal Grandmother    Migraines Paternal Grandfather    Asthma Brother    Allergic rhinitis Brother    Migraines Maternal Aunt    Migraines Maternal Uncle    Social History Social History   Tobacco Use   Smoking status: Never   Smokeless tobacco: Never  Vaping Use   Vaping Use: Never used  Substance Use Topics   Alcohol use: No    Alcohol/week: 0.0 standard drinks   Drug use: No   Allergies   Latex  Review of Systems Review of Systems Pertinent findings noted in history of present illness.   Physical Exam Triage Vital Signs ED Triage Vitals  Enc Vitals Group     BP 11/24/20 0827 (!) 147/82     Pulse Rate 11/24/20 0827 72     Resp 11/24/20 0827 18     Temp 11/24/20 0827 98.3 F (36.8 C)     Temp Source 11/24/20 0827 Oral     SpO2 11/24/20 0827 98 %     Weight --      Height --      Head Circumference --      Peak Flow --      Pain Score 11/24/20 0826  5     Pain Loc --      Pain Edu? --      Excl. in GC? --   No data found.  Updated Vital Signs BP 124/81   Pulse 68   Temp 98.6 F (37 C)   Resp 20   SpO2 97%   Physical Exam Vitals and nursing note reviewed.  Constitutional:      General: She is not in acute distress.    Appearance: Normal appearance. She is not ill-appearing.  HENT:     Head: Normocephalic and atraumatic.  Eyes:     General: Lids are normal.        Right eye: No discharge.        Left eye: No discharge.     Extraocular Movements: Extraocular movements intact.     Conjunctiva/sclera: Conjunctivae normal.     Right eye: Right conjunctiva is not injected.     Left eye: Left conjunctiva is not injected.  Neck:     Trachea: Trachea and phonation normal.  Cardiovascular:     Rate and Rhythm: Normal rate and regular rhythm.     Pulses: Normal pulses.      Heart sounds: Normal heart sounds. No murmur heard.   No friction rub. No gallop.  Pulmonary:     Effort: Pulmonary effort is normal. No accessory muscle usage, prolonged expiration or respiratory distress.     Breath sounds: Normal breath sounds. No stridor, decreased air movement or transmitted upper airway sounds. No decreased breath sounds, wheezing, rhonchi or rales.  Chest:     Chest wall: No tenderness.  Genitourinary:    Comments: Patient politely declines pelvic exam today, patient provided a vaginal swab for testing. Musculoskeletal:        General: Normal range of motion.     Cervical back: Normal range of motion and neck supple. Normal range of motion.  Lymphadenopathy:     Cervical: No cervical adenopathy.  Skin:    General: Skin is warm and dry.     Findings: No erythema or rash.  Neurological:     General: No focal deficit present.     Mental Status: She is alert and oriented to person, place, and time.  Psychiatric:        Mood and Affect: Mood normal.        Behavior: Behavior normal.    Visual Acuity Right Eye Distance:   Left Eye Distance:   Bilateral Distance:    Right Eye Near:   Left Eye Near:    Bilateral Near:     UC Couse / Diagnostics / Procedures:    EKG  Radiology No results found.  Procedures Procedures (including critical care time)  UC Diagnoses / Final Clinical Impressions(s)   I have reviewed the triage vital signs and the nursing notes.  Pertinent labs & imaging results that were available during my care of the patient were reviewed by me and considered in my medical decision making (see chart for details).    Final diagnoses:  Exposure to chlamydia   Patient was provided with Doxycycline 100 mg twice daily for 7 days for empiric treatment of presumed chlamydia based on the history provided to me today.   STD screening was performed, patient advised that the results be posted to their MyChart and if any of the results are  positive, they will be notified by phone, further treatment will be provided as indicated based on results of STD screening.   Urinalysis  today was normal.   Urine pregnancy test was negative.   Return precautions advised.  Drug allergies reviewed, all questions addressed.     ED Prescriptions     Medication Sig Dispense Auth. Provider   doxycycline (VIBRAMYCIN) 100 MG capsule Take 1 capsule (100 mg total) by mouth 2 (two) times daily for 7 days. 14 capsule Theadora Rama Scales, PA-C      PDMP not reviewed this encounter.  Pending results:  Labs Reviewed  POCT URINALYSIS DIP (MANUAL ENTRY) - Normal  POCT URINE PREGNANCY - Normal  CERVICOVAGINAL ANCILLARY ONLY    Medications Ordered in UC: Medications - No data to display  Disposition Upon Discharge:  Condition: stable for discharge home  Patient presented with concern for an acute illness with associated systemic symptoms and significant discomfort requiring urgent management. In my opinion, this is a condition that a prudent lay person (someone who possesses an average knowledge of health and medicine) may potentially expect to result in complications if not addressed urgently such as respiratory distress, impairment of bodily function or dysfunction of bodily organs.   As such, the patient has been evaluated and assessed, work-up was performed and treatment was provided in alignment with urgent care protocols and evidence based medicine.  Patient/parent/caregiver has been advised that the patient may require follow up for further testing and/or treatment if the symptoms continue in spite of treatment, as clinically indicated and appropriate.  Routine symptom specific, illness specific and/or disease specific instructions were discussed with the patient and/or caregiver at length.  Prevention strategies for avoiding STD exposure were also discussed.  The patient will follow up with their current PCP if and as advised. If the  patient does not currently have a PCP we will assist them in obtaining one.   The patient may need specialty follow up if the symptoms continue, in spite of conservative treatment and management, for further workup, evaluation, consultation and treatment as clinically indicated and appropriate.  Patient/parent/caregiver verbalized understanding and agreement of plan as discussed.  All questions were addressed during visit.  Please see discharge instructions below for further details of plan.  Discharge Instructions:   Discharge Instructions      The results of your STD testing today will be made available to you once they are complete, this typically takes 3 to 5 days.  They will initially be posted to your MyChart and, if any of your results are abnormal, you will receive a phone call with those results along with further instructions regarding treatment and any prescriptions, if needed.    Based on the history that you provided to me today, you were treated empirically for chlamydia with a prescription for doxycycline, 1 tablet twice daily for the next 7 days.  Please take all tablets as prescribed, do not skip doses.  Failure to take all doses as prescribed can result in a worsening infection that will be more difficult to treat and resolve.  Please abstain from sexual intercourse for 7 days.   Your urine pregnancy test today is negative.   Please remember that the only way to prevent transmission of sexually transmitted disease when having sexual intercourse is to use condoms.  Repeat sexually transmitted infections can cause scarring in your fallopian tubes which will interfere with your ability to conceive later in life.  Repeat exposures to sexually transmitted diseases can also increase your risk of human papilloma virus which causes cervical cancer and genital warts.   If you have not had complete  resolution of your symptoms after completing treatment, please return for repeat  evaluation.   Thank you for visiting urgent care today.  I appreciate the opportunity to participate in your care.     This office note has been dictated using Teaching laboratory technicianDragon speech recognition software.  Unfortunately, and despite my best efforts, this method of dictation can sometimes lead to occasional typographical or grammatical errors.  I apologize in advance if this occurs.      Theadora RamaMorgan, Luddie Boghosian Scales, PA-C 06/20/21 1229

## 2021-06-20 NOTE — ED Triage Notes (Addendum)
Pt here with exposure to chlamydia and would like pregnancy test. Pt prefers not to have blood work. Pt here also has concern for oral chlamydia.

## 2021-06-22 LAB — CERVICOVAGINAL ANCILLARY ONLY
Bacterial Vaginitis (gardnerella): NEGATIVE
Candida Glabrata: NEGATIVE
Candida Vaginitis: NEGATIVE
Chlamydia: NEGATIVE
Comment: NEGATIVE
Comment: NEGATIVE
Comment: NEGATIVE
Comment: NEGATIVE
Comment: NEGATIVE
Comment: NORMAL
Neisseria Gonorrhea: NEGATIVE
Trichomonas: NEGATIVE

## 2021-07-12 ENCOUNTER — Encounter: Payer: Self-pay | Admitting: Physician Assistant

## 2021-07-12 ENCOUNTER — Ambulatory Visit (INDEPENDENT_AMBULATORY_CARE_PROVIDER_SITE_OTHER): Payer: Medicaid Other | Admitting: Physician Assistant

## 2021-07-12 VITALS — BP 126/80 | HR 96 | Temp 97.7°F | Ht 66.0 in | Wt 138.0 lb

## 2021-07-12 DIAGNOSIS — G43119 Migraine with aura, intractable, without status migrainosus: Secondary | ICD-10-CM | POA: Diagnosis not present

## 2021-07-12 DIAGNOSIS — A6000 Herpesviral infection of urogenital system, unspecified: Secondary | ICD-10-CM

## 2021-07-12 DIAGNOSIS — Z7689 Persons encountering health services in other specified circumstances: Secondary | ICD-10-CM

## 2021-07-12 DIAGNOSIS — N760 Acute vaginitis: Secondary | ICD-10-CM | POA: Diagnosis not present

## 2021-07-12 DIAGNOSIS — L730 Acne keloid: Secondary | ICD-10-CM | POA: Diagnosis not present

## 2021-07-12 DIAGNOSIS — L905 Scar conditions and fibrosis of skin: Secondary | ICD-10-CM

## 2021-07-12 DIAGNOSIS — N898 Other specified noninflammatory disorders of vagina: Secondary | ICD-10-CM | POA: Diagnosis not present

## 2021-07-12 NOTE — Patient Instructions (Signed)
Safe Sex Practicing safe sex means taking steps before and during sex to reduce your risk of: Getting an STI (sexually transmitted infection). Giving your partner an STI. Unwanted or unplanned pregnancy. How to practice safe sex Ways you can practice safe sex  Limit your sexual partners to only one partner who is having sex with only you. Avoid using alcohol and drugs before having sex. Alcohol and drugs can affect your judgment. Before having sex with a new partner: Talk to your partner about past partners, past STIs, and drug use. Get screened for STIs and discuss the results with your partner. Ask your partner to get screened too. Check your body regularly for sores, blisters, rashes, or unusual discharge. If you notice any of these problems, visit your health care provider. Avoid sexual contact if you have symptoms of an infection or you are being treated for an STI. While having sex, use a condom. Make sure to: Use a condom every time you have vaginal, oral, or anal sex. Both females and males should wear condoms during oral sex. Keep condoms in place from the beginning to the end of sexual activity. Use a latex condom, if possible. Latex condoms offer the best protection. Use only water-based lubricants with a condom. Using petroleum-based lubricants or oils will weaken the condom and increase the chance that it will break. Ways your health care provider can help you practice safe sex  See your health care provider for regular screenings, exams, and tests for STIs. Talk with your health care provider about what kind of birth control (contraception) is best for you. Get vaccinated against hepatitis B and human papillomavirus (HPV). If you are at risk of being infected with HIV (human immunodeficiency virus), talk with your health care provider about taking a prescription medicine to prevent HIV infection. You are at risk for HIV if you: Are a man who has sex with other men. Are  sexually active with more than one partner. Take drugs by injection. Have a sex partner who has HIV. Have unprotected sex. Have sex with someone who has sex with both men and women. Have had an STI. Follow these instructions at home: Take over-the-counter and prescription medicines only as told by your health care provider. Keep all follow-up visits. This is important. Where to find more information Centers for Disease Control and Prevention: www.cdc.gov Planned Parenthood: www.plannedparenthood.org Office on Women's Health: www.womenshealth.gov Summary Practicing safe sex means taking steps before and during sex to reduce your risk getting an STI, giving your partner an STI, and having an unwanted or unplanned pregnancy. Before having sex with a new partner, talk to your partner about past partners, past STIs, and drug use. Use a condom every time you have vaginal, oral, or anal sex. Both females and males should wear condoms during oral sex. Check your body regularly for sores, blisters, rashes, or unusual discharge. If you notice any of these problems, visit your health care provider. See your health care provider for regular screenings, exams, and tests for STIs. This information is not intended to replace advice given to you by your health care provider. Make sure you discuss any questions you have with your health care provider. Document Revised: 06/21/2019 Document Reviewed: 06/21/2019 Elsevier Patient Education  2023 [employer]  

## 2021-07-12 NOTE — Progress Notes (Signed)
New Patient Office Visit  Subjective    Patient ID: Felicia Larsen, female    DOB: [birth year]  Age: 26 y.o. MRN: [id number]  CC:  Chief Complaint  Patient presents with   New Patient (Initial Visit)    HPI [name] presents to establish care. Patient has a past medical history of migraine and HSV type II. Patient has several complaints. Reports has a dry scaly rash that comes and goes but has noticed skin becoming darker after the rash disappears. Rash appears at both corners of her mouth. Patient reports long hx of migraine. States has blurred vision and bright lights bother her. States medication makes her nauseous so not interested on that. Wants to get established with Neurology. Has a headache 1-2 times per week and they last 24 hours or more. Does take Tylenol 1000 mg when the headache is severe which does help. Also reports issues with recurrent vaginal odor- musty smell and discharge. Discharge is clear and sometimes has vaginal itching.     No outpatient encounter medications on file as of 07/12/2021.   No facility-administered encounter medications on file as of 07/12/2021.    Past Medical History:  Diagnosis Date   Anal fissure    Chlamydia 12/22/2013   Constipation    Headache(784.0)    Renal calculi    Renal disorder    Urinary tract infection    E.Coli resistant to Bactrim    History reviewed. No pertinent surgical history.  Family History  Problem Relation Age of Onset   Kidney Stones Mother    GI problems Mother    Hypertension Mother    Migraines Maternal Grandmother    Heart disease Maternal Grandmother    Stroke Maternal Grandmother    Asthma Sister    Asthma Brother    Diabetes Paternal Grandmother    Migraines Paternal Grandfather    Asthma Brother    Allergic rhinitis Brother    Migraines Maternal Aunt    Migraines Maternal Uncle     Social History   Socioeconomic History   Marital status: Single    Spouse name: Not on  file   Number of children: 0   Years of education: 12   Highest education level: Not on file  Occupational History   Occupation: [employer]  Tobacco Use   Smoking status: Never   Smokeless tobacco: Never  Vaping Use   Vaping Use: Never used  Substance and Sexual Activity   Alcohol use: No    Alcohol/week: 0.0 standard drinks of alcohol   Drug use: No   Sexual activity: Yes    Birth control/protection: Condom    Comment: 3 to months ago.Marland Kitchen 10-16-15 2 days ago   Other Topics Concern   Not on file  Social History Narrative   Lives w/ mother   Caffeine use: none   Right handed    Social Determinants of Health   Financial Resource Strain: Low Risk  (02/13/2021)   Overall Financial Resource Strain (CARDIA)    Difficulty of Paying Living Expenses: Not very hard  Food Insecurity: No Food Insecurity (02/13/2021)   Hunger Vital Sign    Worried About Running Out of Food in the Last Year: Never true    Ran Out of Food in the Last Year: Never true  Transportation Needs: No Transportation Needs (02/13/2021)   PRAPARE - Administrator, Civil Service (Medical): No    Lack of Transportation (Non-Medical): No  Physical Activity: Not on file  Stress: Not on file  Social Connections: Not on file  Intimate Partner Violence: Not on file    ROS Review of Systems:  A fourteen system review of systems was performed and found to be positive as per HPI.      Objective    BP 126/80   Pulse 96   Temp 97.7 F (36.5 C)   Ht 5\' 6"  (1.676 m)   Wt 138 lb (62.6 kg)   LMP 06/08/2021 (Exact Date)   SpO2 100%   BMI 22.27 kg/m   Physical Exam General:  Well Developed, well nourished, appropriate for stated age.  Neuro:  Alert and oriented,  extra-ocular muscles intact  HEENT:  Normocephalic, atraumatic, neck supple  Skin:  no gross rash, warm, pink. Cardiac:  RRR Respiratory: Speaking in full sentence, unlabored. Vascular:  Ext warm, no cyanosis apprec.; no gross edema Psych:  No  HI/SI, judgement and insight good, Euthymic mood. Full Affect.      Assessment & Plan:   Problem List Items Addressed This Visit       Cardiovascular and Mediastinum   Migraine with aura - Primary   Relevant Orders   Ambulatory referral to Neurology   Other Visit Diagnoses     Vaginal odor       Relevant Orders   Ambulatory referral to Obstetrics / Gynecology   Genital herpes simplex, unspecified site       Relevant Orders   Ambulatory referral to Obstetrics / Gynecology   Acne scarring       Relevant Orders   Ambulatory referral to Dermatology   Recurrent vaginitis       Relevant Orders   Ambulatory referral to Obstetrics / Gynecology      Encounter to establish care: -Reviewed records in Epic. Patient has hx of recurrent vaginitis and symptomatic with vaginal discharge and malodor. Reviewed lab results from 3 weeks ago and it was negative for GC/chlamydia, trichomonas, BV and candida. Also reports last pap was 3 years ago. Will place referral to OB/GYN for further evaluation.   Migraine with aura: -Discussed with patient to continue Tylenol 1000 mg as needed for severe headache. Discussed non-pharmacologic therapy including hydration, good sleep hygiene and avoiding potential triggers. Will place referral to neurology.  Acne scarring: -Will place referral to dermatology.     Return in about 3 months (around 10/12/2021) for CPE.   10/14/2021, PA-C

## 2021-08-16 ENCOUNTER — Ambulatory Visit: Payer: Medicaid Other | Admitting: Neurology

## 2021-08-17 ENCOUNTER — Encounter: Payer: Self-pay | Admitting: Physician Assistant

## 2021-08-23 ENCOUNTER — Encounter: Payer: Self-pay | Admitting: Psychiatry

## 2021-08-23 ENCOUNTER — Telehealth: Payer: Self-pay | Admitting: Psychiatry

## 2021-08-23 ENCOUNTER — Telehealth: Payer: Self-pay | Admitting: *Deleted

## 2021-08-23 ENCOUNTER — Ambulatory Visit: Payer: Medicaid Other | Admitting: Psychiatry

## 2021-08-23 ENCOUNTER — Encounter: Payer: Self-pay | Admitting: *Deleted

## 2021-08-23 VITALS — BP 118/76 | HR 105 | Ht 66.0 in | Wt 142.0 lb

## 2021-08-23 DIAGNOSIS — F439 Reaction to severe stress, unspecified: Secondary | ICD-10-CM

## 2021-08-23 DIAGNOSIS — R29818 Other symptoms and signs involving the nervous system: Secondary | ICD-10-CM | POA: Diagnosis not present

## 2021-08-23 DIAGNOSIS — R519 Headache, unspecified: Secondary | ICD-10-CM

## 2021-08-23 DIAGNOSIS — G43109 Migraine with aura, not intractable, without status migrainosus: Secondary | ICD-10-CM | POA: Diagnosis not present

## 2021-08-23 MED ORDER — NARATRIPTAN HCL 2.5 MG PO TABS: 2.5000 mg | ORAL_TABLET | ORAL | 6 refills | Status: DC | PRN

## 2021-08-23 NOTE — Telephone Encounter (Signed)
Naratriptan PA, Key: [id number], [id number].  Your information has been sent to [employer] [facility].

## 2021-08-23 NOTE — Patient Instructions (Addendum)
Brain MRI Start naratriptan as needed for migraine. Take one pill at onset of migraine, can repeat a dose in 2 hours if headache persists. Max dose 2 pills in 24 hours Start magnesium oxide 400 mg daily for headache prevention [email or web address] has free online classes Referral to psychology  GENERAL HEADACHE INSTRUCTIONS Headache Preventive Treatment: Please keep in mind that it takes 4-6 weeks for the medication to start working well and 2-3 months at the appropriate dose before deciding if it will be useful or not. If it is not helping at all by this time, then we will discuss other medications to try. Supplements may take 3-6 months until you see full effect.   Natural supplements: Magnesium Oxide or Magnesium Glycinate 500 mg at bed (up to 800 mg daily) Coenzyme Q10 300 mg in AM Vitamin B2- 200 mg twice a day  Add 1 supplement at a time since even natural supplements can have undesirable side effects. You can sometimes buy supplements cheaper (especially Coenzyme Q10) at www.WebmailGuide.co.za or at [employer].  Vitamins and herbs that show potential:  Magnesium: Magnesium (250 mg twice a day or 500 mg at bed) has a relaxant effect on smooth muscles such as blood vessels. Individuals suffering from frequent or daily headache usually have low magnesium levels which can be increase with daily supplementation of 400-750 mg. Three trials found 40-90% average headache reduction  when used as a preventative. Magnesium also demonstrated the benefit in menstrually related migraine.  Magnesium is part of the messenger system in the serotonin cascade and it is a good muscle relaxant.  It is also useful for constipation which can be a side effect of other medications used to treat migraine. Good sources include nuts, whole grains, and tomatoes. Side Effects: loose stool/diarrhea Riboflavin (vitamin B 2) 200 mg twice a day. This vitamin assists nerve cells in the production of ATP a principal energy storing molecule.   It is necessary for many chemical reactions in the body.  There have been at least 3 clinical trials of riboflavin using 400 mg per day all of which suggested that migraine frequency can be decreased.  All 3 trials showed significant improvement in over half of migraine sufferers.  The supplement is found in bread, cereal, milk, meat, and poultry.  Most Americans get more riboflavin than the recommended daily allowance, however riboflavin deficiency is not necessary for the supplements to help prevent headache. Side effects: energizing, green urine  Coenzyme Q10: This is present in almost all cells in the body and is critical component for the conversion of energy.  Recent studies have shown that a nutritional supplement of CoQ10 can reduce the frequency of migraine attacks by improving the energy production of cells as with riboflavin.  Doses of 150 mg twice a day have been shown to be effective.  Melatonin: Increasing evidence shows correlation between melatonin secretion and headache conditions.  Melatonin supplementation has decreased headache intensity and duration.  It is widely used as a sleep aid.  Sleep is natures way of dealing with migraine.  A dose of 3 mg is recommended to start for headaches including cluster headache. Higher doses up to 15 mg has been reviewed for use in Cluster headache and have been used. The rationale behind using melatonin for cluster is that many theories regarding the cause of Cluster headache center around the disruption of the normal circadian rhythm in the brain.  This helps restore the normal circadian rhythm.  Ginger: Ginger has a small amount of  antihistamine and anti-inflammatory action which may help headache.  It is primarily used for nausea and may aid in the absorption of other medications. HEADACHE DIET: Foods and beverages which may trigger migraine Note that only 20% of headache patients are food sensitive. You will know if you are food sensitive if you  get a headache consistently 20 minutes to 2 hours after eating a certain food. Only cut out a food if it causes headaches, otherwise you might remove foods you enjoy! What matters most for diet is to eat a well balanced healthy diet full of vegetables and low fat protein, and to not miss meals.  Chocolate, other sweets ALL cheeses except cottage and cream cheese Dairy products, yogurt, sour cream, ice cream Liver Meat extracts (Bovril, Marmite, meat tenderizers) Meats or fish which have undergone aging, fermenting, pickling or smoking. These include: Hotdogs,salami,Lox,sausage, mortadellas,smoked salmon, pepperoni, Pickled herring Pods of broad bean (English beans, Chinese pea pods, [place] (fava) beans, lima and navy beans Ripe avocado, ripe banana Yeast extracts or active yeast preparations such as Brewer's or Fleishman's (commercial bakes goods are permitted) Tomato based foods, pizza (lasagna, etc.)  MSG (monosodium glutamate) is disguised as many things; look for these common aliases: Monopotassium glutamate Autolysed yeast Hydrolysed protein Sodium caseinate "flavorings" "all natural preservatives" Nutrasweet  Avoid all other foods that convincingly provoke headaches.  Resources: The Dizzy [place]air Laundry Your Headache Diet, migrainestrong.com  https://zamora-andrews.com/  Caffeine and Migraine For patients that have migraine, caffeine intake more than 3 days per week can lead to dependency and increased migraine frequency. I would recommend cutting back on your caffeine intake as best you can. The recommended amount of caffeine is 200-300 mg daily, although migraine patients may experience dependency at even lower doses. While you may notice an increase in headache temporarily, cutting back will be helpful for headaches in the long run. For more information on caffeine and migraine, visit:  https://americanmigrainefoundation.org/resource-library/caffeine-and-migraine/  Headache Prevention Strategies:  1. Maintain a headache diary; learn to identify and avoid triggers.  - This can be a simple note where you log when you had a headache, associated symptoms, and medications used - There are several smartphone apps developed to help track migraines: Migraine Buddy, Migraine Monitor, Curelator N1-Headache App  Common triggers include: Emotional triggers: Emotional/Upset family or friends Emotional/Upset occupation Business reversal/success Anticipation anxiety Crisis-serious Post-crisis periodNew job/position   Physical triggers: Vacation Day Weekend Strenuous Exercise High Altitude Location New Move Menstrual Day Physical Illness Oversleep/Not enough sleep Weather changes Light: Photophobia or light sesnitivity treatment involves a balance between desensitization and reduction in overly strong input. Use dark polarized glasses outside, but not inside. Avoid bright or fluorescent light, but do not dim environment to the point that going into a normally lit room hurts. Consider FL-41 tint lenses, which reduce the most irritating wavelengths without blocking too much light.  These can be obtained at axonoptics.com or theraspecs.com Foods: see list above.  2. Limit use of acute treatments (over-the-counter medications, triptans, etc.) to no more than 2 days per week or 10 days per month to prevent medication overuse headache (rebound headache).    3. Follow a regular schedule (including weekends and holidays): Don't skip meals. Eat a balanced diet. 8 hours of sleep nightly. Minimize stress. Exercise 30 minutes per day. Being overweight is associated with a 5 times increased risk of chronic migraine. Keep well hydrated and drink 6-8 glasses of water per day.  4. Initiate non-pharmacologic measures at the earliest onset of your headache.  Rest and quiet environment. Relax  and reduce stress. Breathe2Relax is a free app that can instruct you on    some simple relaxtion and breathing techniques. Http://Dawnbuse.com is a    free website that provides teaching videos on relaxation.  Also, there are  many apps that   can be downloaded for "mindful" relaxation.  An app called YOGA NIDRA will help walk you through mindfulness. Another app called Calm can be downloaded to give you a structured mindfulness guide with daily reminders and skill development. Headspace for guided meditation Mindfulness Based Stress Reduction Online Course: www.palousemindfulness.com Cold compresses.  5. Don't wait!! Take the maximum allowable dosage of prescribed medication at the first sign of migraine.  6. Compliance:  Take prescribed medication regularly as directed and at the first sign of a migraine.  7. Communicate:  Call your physician when problems arise, especially if your headaches change, increase in frequency/severity, or become associated with neurological symptoms (weakness, numbness, slurred speech, etc.).  8. Headache/pain management therapies: Consider various complementary methods, including medication, behavioral therapy, psychological counselling, biofeedback, massage therapy, acupuncture, dry needling, and other modalities.  Such measures may reduce the need for medications. Counseling for pain management, where patients learn to function and ignore/minimize their pain, seems to work very well.  9. Recommend changing family's attention and focus away from patient's headaches. Instead, emphasize daily activities. If first question of day is 'How are your headaches/Do you have a headache today?', then patient will constantly think about headaches, thus making them worse. Goal is to re-direct attention away from headaches, toward daily activities and other distractions.  10. Helpful  Websites: www.AmericanHeadacheSociety.org PatentHood.ch www.headaches.org TightMarket.nl www.achenet.org  11. HEADACHE EXPECTATIONS: There are many types of headaches, and only a rare few in which complete relief can be expected.  In general, there is no cure for headache, especially migraine based headaches.  There is nothing available that completely prevents headaches from occurring, breaking through, or having periodic flare-ups and fluctuations.  Regardless of what you are using on a daily basis for prevention, episodic headaches should still be expected, and periods where frequency may escalate and fluctuate are unavoidable.   There is no quick fix for most headaches.  Furthermore, the longer you have had high frequency headaches (such as chronic daily headache), the longer it will likely take to expect any improvement.  In fact, some people will never improve, regardless of how many medications or other treatments we try.  Our treatment strategy is to evaluate for possible causes of your headache, although testing is usually always normal, even in cases of daily continuous headaches for years.  Most types of headache such as migraine are electrical brain disorders (similar to how epilepsy is an electrical brain disorders).  Therefore, there is no testing that will reveal this "dysfunctional electrical circuitry" such on MRI, or other testing.  We try to find a medication that may help lessen the frequency and/or severity of your headaches.  The goal is not to completely stop them from happening, although if that happens, great!  Different people respond to different medications, and some people just don't respond to anything, so it's usually a matter of trying different options.  We cannot predict if or when exactly you will respond to a treatment that we provide.   Preventive headache medications take 4-6 weeks to start working, and 2-3 months to see full effect, assuming you reach  an effective dose.  Therefore, calling or messaging frequently because you have a headache  flare prior to the 3 month mark is unlikely to change anything, and unfortunately there is nothing available that will expedite this, so please try to avoid this.  Our recommendation will generally be to give it adequate time first.  If you are unable to wait it out for medications to work, we can also try IV infusions for some temporary relief.     In general, the best that preventive medications or other treatments (including Botox) are able to offer in migraine management (variable in other headache types) is a 50% improvement in frequency and/or severity of headache.  That is our goal, and any additional benefit is considered a bonus.  Some people do significantly better than this, others do not get close to this.  Therefore, if your headaches are not improving by at least 3 months on your preventive strategy, contact us and we can discuss further adjustments.  Keep in mind that complete headache cure is not a realistic expectation.  Refills: Please pay attention to when your refills will need to be renewed. Due to the volume of phone calls daily, this could potentially take a few days, although we certainly try to honor your refill requests as soon as we can.  You should call at least 1 week in advance of needing a refill to ensure you do not run out of medication.  Keep in mind that refill requests on Fridays may not be filled until the following week.

## 2021-08-23 NOTE — Progress Notes (Signed)
Referring:  Mayer Masker, PA-C [address] [place],  [place] [address]  PCP: Mayer Masker, PA-C  Neurology was asked to evaluate Felicia Larsen, a 26 year old female for a chief complaint of headaches.  Our recommendations of care will be communicated by shared medical record.    CC:  headaches  History provided from self  HPI:  Medical co-morbidities: kidney stones  The patient presents for evaluation of headaches which began when she was a child. They have worsened over time. She previously saw Dr. Anne Hahn in 2019 for migraines and was treated with Topamax and Maxalt.  Currently she gets 1-2 migraines per month. They are described as frontal pressure with associated photophobia, phonophobia, and nausea. She will have a visual aura (stars in her vision) before her migraines. They can last 24 hours at a time.   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 Was supposed to follow up with neurology but never did.  Headache History: Onset: childhood Triggers: stress, lack of sleep Aura: stars in her vision Location: frontal, unilateral Quality/Description: pressure Associated Symptoms:  Photophobia: yes  Phonophobia: yes  Nausea: yes Vomiting: yes Worse with activity?: yes Duration of headaches: 24 hours  Headache days per month: 2 Headache free days per month: 28  Current Treatment: Abortive Ibuprofen tylenol  Preventative none  Prior Therapies                                 Topamax - kidney stones Maxalt - drowsiness Imitrex  Tylenol ibuprofen  LABS: CBC    Component Value Date/Time   WBC 8.6 02/25/2016 1350   RBC 4.21 02/25/2016 1350   HGB 11.5 (L) 02/25/2016 1350   HCT 34.9 (L) 02/25/2016 1350   PLT 172 02/25/2016 1350   MCV 82.9 02/25/2016 1350   MCH 27.3 02/25/2016 1350   MCHC 33.0 02/25/2016 1350   RDW 14.3 02/25/2016 1350   LYMPHSABS 1.4 02/25/2016 1350   MONOABS 0.9  02/25/2016 1350   EOSABS 0.0 02/25/2016 1350   BASOSABS 0.0 02/25/2016 1350      Latest Ref Rng & Units 02/25/2016    1:50 PM 04/07/2013    1:05 PM  CMP  Glucose 65 - 99 mg/dL 90  94   BUN 6 - 20 mg/dL 8  9   Creatinine 0.93 - 1.00 mg/dL 2.35  5.73   Sodium 220 - 145 mmol/L 136  141   Potassium 3.5 - 5.1 mmol/L 3.2  4.1   Chloride 101 - 111 mmol/L 107  105   CO2 22 - 32 mmol/L 22  27   Calcium 8.9 - 10.3 mg/dL 9.0  9.6   Total Protein 6.5 - 8.1 g/dL 7.0  6.9   Total Bilirubin 0.3 - 1.2 mg/dL 0.6  0.4   Alkaline Phos 38 - 126 U/L 77  94   AST 15 - 41 U/L 19  19   ALT 14 - 54 U/L 13  16      IMAGING:  [facility] 2007: unremarkable   No current outpatient medications on file prior to visit.   No current facility-administered medications on file prior to visit.     Allergies: Allergies  Allergen Reactions   Latex Itching and Rash    IRRITATION    Family History: Migraine or other headaches in the family:  grandfather,  mother Aneurysms in a first degree relative:  no Brain tumors in the family:  maternal aunt had a tumor Other neurological illness in the family:   none  Past Medical History: Past Medical History:  Diagnosis Date   Anal fissure    Chlamydia 12/22/2013   Constipation    Headache(784.0)    Renal calculi    Renal disorder    Urinary tract infection    E.Coli resistant to Bactrim    Past Surgical History History reviewed. No pertinent surgical history.  Social History: Social History   Tobacco Use   Smoking status: Never   Smokeless tobacco: Never  Vaping Use   Vaping Use: Never used  Substance Use Topics   Alcohol use: No    Alcohol/week: 0.0 standard drinks of alcohol   Drug use: No    ROS: Negative for fevers, chills. Positive for headaches. All other systems reviewed and negative unless stated otherwise in HPI.   Physical Exam:   Vital Signs: BP 118/76   Pulse (!) 105   Ht 5\' 6"  (1.676 m)   Wt 142 lb (64.4 kg)   BMI 22.92  kg/m  GENERAL: well appearing,in no acute distress,alert SKIN:  Color, texture, turgor normal. No rashes or lesions HEAD:  Normocephalic/atraumatic. CV:  RRR RESP: Normal respiratory effort MSK: no tenderness to palpation over occiput, neck, or shoulders  NEUROLOGICAL: Mental Status: Alert, oriented to person, place and time,Follows commands Cranial Nerves: PERRL, visual fields intact to confrontation, extraocular movements intact, decreased sensation right V1-3, no facial droop or ptosis, hearing grossly intact, no dysarthria, palate elevate symmetrically, tongue protrudes midline, shoulder shrug intact and symmetric Motor: muscle strength 5/5 both upper and lower extremities,no drift, normal tone Reflexes: 2+ throughout Sensation: decreased sensation RLE, otherwise sensation intact to light touch all 4 extremities Coordination: Finger-to- nose-finger intact bilaterally,Heel-to-shin intact bilaterally Gait: normal-based   IMPRESSION: 26 year old female with a history of kidney stones who presents for evaluation of migraines. Will order MRI brain as exam reveals decreased sensation over right face and leg, and to assess for reported cyst in her brain found on CTH. Will start naratriptan as needed for migraine rescue. Supplement information for migraine prevention provided. Referral for psychology placed to help with stress management.  PLAN: -MRI brain -Rescue: Start naratriptan 2.5 mg PRN -Supplement information for migraine prevention provided (Mg, B2, CoQ10) -Referral to Psychology for stress management   I spent a total of 35 minutes chart reviewing and counseling the patient. Headache education was done. Discussed treatment options including preventive and acute medications, and natural supplements Discussed medication side effects, adverse reactions and drug interactions. Written educational materials and patient instructions outlining all of the above were given.  Follow-up: 6  months   25, MD 08/23/2021   2:58 PM

## 2021-08-23 NOTE — Telephone Encounter (Signed)
Referral sent to [facility] [phone]. ?

## 2021-08-23 NOTE — Telephone Encounter (Signed)
Naratriptan Approved, PA Case: [id number], Coverage Starts on: 08/23/2021 12:00:00 AM, Coverage Ends on: 08/23/2022. Sent my chart to inform patient.

## 2021-08-24 ENCOUNTER — Encounter: Payer: Self-pay | Admitting: Psychiatry

## 2021-08-27 ENCOUNTER — Telehealth: Payer: Self-pay | Admitting: Psychiatry

## 2021-08-27 NOTE — Telephone Encounter (Signed)
Patient is aware that [facility] is out of network with her insurance place so she is not able to get an open MRI and she will call the hospital to schedule.

## 2021-08-27 NOTE — Telephone Encounter (Signed)
medicaid healthy blue [employer]k[facility] Harvey: [id number] exp. 08/27/21-10/25/21 sent to [facility]

## 2021-09-01 ENCOUNTER — Ambulatory Visit (HOSPITAL_COMMUNITY): Payer: Medicaid Other

## 2021-09-06 DIAGNOSIS — B3731 Acute candidiasis of vulva and vagina: Secondary | ICD-10-CM | POA: Diagnosis not present

## 2021-09-06 DIAGNOSIS — N898 Other specified noninflammatory disorders of vagina: Secondary | ICD-10-CM | POA: Diagnosis not present

## 2021-09-06 DIAGNOSIS — N760 Acute vaginitis: Secondary | ICD-10-CM | POA: Diagnosis not present

## 2021-09-13 ENCOUNTER — Ambulatory Visit (HOSPITAL_COMMUNITY)
Admission: RE | Admit: 2021-09-13 | Discharge: 2021-09-13 | Disposition: A | Discharge status: Home / Self Care | Payer: Medicaid Other | Source: Ambulatory Visit | Attending: Psychiatry | Admitting: Psychiatry

## 2021-09-13 DIAGNOSIS — R519 Headache, unspecified: Secondary | ICD-10-CM | POA: Diagnosis not present

## 2021-09-13 DIAGNOSIS — R29818 Other symptoms and signs involving the nervous system: Secondary | ICD-10-CM | POA: Diagnosis not present

## 2021-09-13 DIAGNOSIS — G9389 Other specified disorders of brain: Secondary | ICD-10-CM | POA: Diagnosis not present

## 2021-09-13 MED ORDER — GADOBUTROL 1 MMOL/ML IV SOLN
6.0000 mL | Freq: Once | INTRAVENOUS | Status: AC | PRN
Start: 2021-09-13 — End: 2021-09-13
  Administered 2021-09-13: 6 mL via INTRAVENOUS

## 2021-10-24 DIAGNOSIS — N760 Acute vaginitis: Secondary | ICD-10-CM | POA: Diagnosis not present

## 2021-10-24 DIAGNOSIS — N898 Other specified noninflammatory disorders of vagina: Secondary | ICD-10-CM | POA: Diagnosis not present

## 2021-11-01 ENCOUNTER — Encounter: Payer: Medicaid Other | Admitting: Physician Assistant

## 2021-12-05 ENCOUNTER — Ambulatory Visit
Admission: EM | Admit: 2021-12-05 | Discharge: 2021-12-05 | Disposition: A | Discharge status: Home / Self Care | Payer: Medicaid Other | Attending: Emergency Medicine | Admitting: Emergency Medicine

## 2021-12-05 DIAGNOSIS — N760 Acute vaginitis: Secondary | ICD-10-CM | POA: Insufficient documentation

## 2021-12-05 LAB — POCT URINALYSIS DIP (MANUAL ENTRY)
Bilirubin, UA: NEGATIVE
Blood, UA: NEGATIVE
Glucose, UA: NEGATIVE mg/dL
Ketones, POC UA: NEGATIVE mg/dL
Leukocytes, UA: NEGATIVE
Nitrite, UA: NEGATIVE
Protein Ur, POC: NEGATIVE mg/dL
Spec Grav, UA: 1.025 (ref 1.010–1.025)
Urobilinogen, UA: 0.2 E.U./dL
pH, UA: 7 (ref 5.0–8.0)

## 2021-12-05 LAB — POCT URINE PREGNANCY: Preg Test, Ur: NEGATIVE

## 2021-12-05 NOTE — ED Triage Notes (Signed)
Pelvic pain, small pain when urinate and cottage cheese discharge. Been having BV on and off for past 5 years.

## 2021-12-05 NOTE — ED Provider Notes (Signed)
[facility]    CSN: [id number] Arrival date & time: 12/05/21  6378    HISTORY   Chief Complaint  Patient presents with   Pelvic Pain    Thinks it may be BV   HPI Felicia Larsen is a pleasant, 26 y.o. female who presents to urgent care today. Patient complains of pelvic discomfort, mild burning with urination and discharge that resembles cottage cheese.  Patient reports having intermittent episodes of BV for the past 5 years.  EMR reviewed, most recent diagnosis and treatment of BV was September 27, prior to that on August 10 patient was diagnosed and treated for BV and vaginal Candida.  Patient denies new sex partners, known exposure to STD.  STD testing performed September 27 was negative for gonorrhea, chlamydia and trichomoniasis.  Patient states that her grandmother and her mother both suffer from frequent episodes of BV as well.  The history is provided by the patient.   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 No pertinent surgical history. OB History   No obstetric history on file.    Home Medications    Prior to Admission medications   Not on File    Family History Family History  Problem Relation Age of Onset   Kidney Stones Mother    GI problems Mother    Hypertension Mother    Migraines Maternal Grandmother    Heart disease  Maternal Grandmother    Stroke Maternal Grandmother    Asthma Sister    Asthma Brother    Diabetes Paternal Grandmother    Migraines Paternal Grandfather    Asthma Brother    Allergic rhinitis Brother    Migraines Maternal Aunt    Migraines Maternal Uncle    Social History Social History   Tobacco Use   Smoking status: Never   Smokeless tobacco: Never  Vaping Use   Vaping Use: Never used  Substance Use Topics   Alcohol use: No    Alcohol/week: 0.0 standard drinks of alcohol   Drug use: No   Allergies   Latex  Review of Systems Review of Systems Pertinent findings revealed after performing a 14 point review of systems has been noted in the history of present illness.  Physical Exam Triage Vital Signs ED Triage Vitals  Enc Vitals Group     BP 11/24/20 0827 (!) 147/82     Pulse Rate 11/24/20 0827 72     Resp 11/24/20 0827 18     Temp 11/24/20 0827 98.3 F (36.8 C)     Temp Source 11/24/20 0827 Oral     SpO2 11/24/20 0827 98 %     Weight --      Height --      Head Circumference --      Peak Flow --      Pain Score  11/24/20 0826 5     Pain Loc --      Pain Edu? --      Excl. in GC? --   No data found.  Updated Vital Signs BP 118/76 (BP Location: Right Arm)   Pulse 74   Temp 98.8 F (37.1 C) (Oral)   Resp 18   LMP 11/13/2021   SpO2 98%   Physical Exam Vitals and nursing note reviewed.  Constitutional:      General: She is not in acute distress.    Appearance: Normal appearance. She is not ill-appearing.  HENT:     Head: Normocephalic and atraumatic.  Eyes:     General: Lids are normal.        Right eye: No discharge.        Left eye: No discharge.     Extraocular Movements: Extraocular movements intact.     Conjunctiva/sclera: Conjunctivae normal.     Right eye: Right conjunctiva is not injected.     Left eye: Left conjunctiva is not injected.  Neck:     Trachea: Trachea and phonation normal.  Cardiovascular:     Rate and Rhythm: Normal rate  and regular rhythm.     Pulses: Normal pulses.     Heart sounds: Normal heart sounds. No murmur heard.    No friction rub. No gallop.  Pulmonary:     Effort: Pulmonary effort is normal. No accessory muscle usage, prolonged expiration or respiratory distress.     Breath sounds: Normal breath sounds. No stridor, decreased air movement or transmitted upper airway sounds. No decreased breath sounds, wheezing, rhonchi or rales.  Chest:     Chest wall: No tenderness.  Genitourinary:    Comments: Patient politely declines pelvic exam today, patient provided a vaginal swab for testing. Musculoskeletal:        General: Normal range of motion.     Cervical back: Normal range of motion and neck supple. Normal range of motion.  Lymphadenopathy:     Cervical: No cervical adenopathy.  Skin:    General: Skin is warm and dry.     Findings: No erythema or rash.  Neurological:     General: No focal deficit present.     Mental Status: She is alert and oriented to person, place, and time.  Psychiatric:        Mood and Affect: Mood normal.        Behavior: Behavior normal.     Visual Acuity Right Eye Distance:   Left Eye Distance:   Bilateral Distance:    Right Eye Near:   Left Eye Near:    Bilateral Near:     UC Couse / Diagnostics / Procedures:     Radiology No results found.  Procedures Procedures (including critical care time) EKG  Pending results:  Labs Reviewed  POCT URINALYSIS DIP (MANUAL ENTRY)  POCT URINE PREGNANCY  CERVICOVAGINAL ANCILLARY ONLY    Medications Ordered in UC: Medications - No data to display  UC Diagnoses / Final Clinical Impressions(s)   I have reviewed the triage vital signs and the nursing notes.  Pertinent labs & imaging results that were available during my care of the patient were reviewed by me and considered in my medical decision making (see chart for details).    Final diagnoses:  Vaginitis and vulvovaginitis   Urine pregnancy test today  was negative.  Urinalysis today was normal.  Patient advised that due to frequent episodes of BV, if she test positive for  BV again recommend that she receive a 14-day course of metronidazole 500 mg twice daily as well as 7-day treatment with clindamycin vaginal gel.  Patient advised to resume using boric acid suppositories once BV treatment is completed, resume boric acid suppositories ASAP if vaginal swab testing is negative.  Advised that if she test positive for vaginal Candida I recommend  Diflucan 150 mg every 72 hours to ensure complete resolution given history of recurrence.  ED Prescriptions   None    PDMP not reviewed this encounter.  Disposition Upon Discharge:  Condition: stable for discharge home  Patient presented with concern for an acute illness with associated systemic symptoms and significant discomfort requiring urgent management. In my opinion, this is a condition that a prudent lay person (someone who possesses an average knowledge of health and medicine) may potentially expect to result in complications if not addressed urgently such as respiratory distress, impairment of bodily function or dysfunction of bodily organs.   As such, the patient has been evaluated and assessed, work-up was performed and treatment was provided in alignment with urgent care protocols and evidence based medicine.  Patient/parent/caregiver has been advised that the patient may require follow up for further testing and/or treatment if the symptoms continue in spite of treatment, as clinically indicated and appropriate.  Routine symptom specific, illness specific and/or disease specific instructions were discussed with the patient and/or caregiver at length.  Prevention strategies for avoiding STD exposure were also discussed.  The patient will follow up with their current PCP if and as advised. If the patient does not currently have a PCP we will assist them in obtaining one.   The patient may need  specialty follow up if the symptoms continue, in spite of conservative treatment and management, for further workup, evaluation, consultation and treatment as clinically indicated and appropriate.  Patient/parent/caregiver verbalized understanding and agreement of plan as discussed.  All questions were addressed during visit.  Please see discharge instructions below for further details of plan.  Discharge Instructions:   Discharge Instructions      The results of your vaginal swab test which screens for BV, yeast, gonorrhea, chlamydia and trichomonas will be made posted to your MyChart account once it is complete.  This typically takes 2 to 4 days.  Please abstain from sexual intercourse of any kind, vaginal, oral or anal, until you have received the results of your STD testing.     If any of your results are abnormal, you will receive a phone call regarding treatment.  Prescriptions, if any are needed, will be provided for you at your pharmacy.    Because you have frequent episodes of BV and vaginal Candida, if either test is positive you will be provided with more aggressive treatment than the standard of care, I have documented this in your encounter note for our callback nurse to reference.   Your urine pregnancy test today is negative.   Our point-of-care analysis of your urine sample today was normal and did not reveal any concern for urinary tract infection.   Thank you for visiting urgent care today.  I appreciate the opportunity to participate in your care.       This office note has been dictated using Teaching laboratory technician.  Unfortunately, this method of dictation can sometimes lead to typographical or grammatical errors.  I apologize for your inconvenience in advance if this occurs.  Please do not hesitate to reach out to me if clarification is needed.  Theadora Rama Scales, PA-C 12/05/21 1048

## 2021-12-05 NOTE — Discharge Instructions (Signed)
The results of your vaginal swab test which screens for BV, yeast, gonorrhea, chlamydia and trichomonas will be made posted to your MyChart account once it is complete.  This typically takes 2 to 4 days.  Please abstain from sexual intercourse of any kind, vaginal, oral or anal, until you have received the results of your STD testing.     If any of your results are abnormal, you will receive a phone call regarding treatment.  Prescriptions, if any are needed, will be provided for you at your pharmacy.    Because you have frequent episodes of BV and vaginal Candida, if either test is positive you will be provided with more aggressive treatment than the standard of care, I have documented this in your encounter note for our callback nurse to reference.   Your urine pregnancy test today is negative.   Our point-of-care analysis of your urine sample today was normal and did not reveal any concern for urinary tract infection.   Thank you for visiting urgent care today.  I appreciate the opportunity to participate in your care.

## 2021-12-06 LAB — CERVICOVAGINAL ANCILLARY ONLY
Bacterial Vaginitis (gardnerella): NEGATIVE
Candida Glabrata: NEGATIVE
Candida Vaginitis: POSITIVE — AB
Chlamydia: NEGATIVE
Comment: NEGATIVE
Comment: NEGATIVE
Comment: NEGATIVE
Comment: NEGATIVE
Comment: NEGATIVE
Comment: NORMAL
Neisseria Gonorrhea: NEGATIVE
Trichomonas: NEGATIVE

## 2021-12-07 ENCOUNTER — Telehealth (HOSPITAL_COMMUNITY): Payer: Self-pay

## 2021-12-07 MED ORDER — FLUCONAZOLE 150 MG PO TABS: 150.0000 mg | ORAL_TABLET | Freq: Every day | ORAL | 0 refills | Status: DC

## 2022-01-02 ENCOUNTER — Encounter: Payer: Medicaid Other | Admitting: Physician Assistant

## 2022-02-06 DIAGNOSIS — N898 Other specified noninflammatory disorders of vagina: Secondary | ICD-10-CM | POA: Diagnosis not present

## 2022-02-07 ENCOUNTER — Encounter: Payer: Medicaid Other | Admitting: Nurse Practitioner

## 2022-03-07 ENCOUNTER — Ambulatory Visit: Payer: Medicaid Other | Admitting: Neurology

## 2022-03-13 ENCOUNTER — Telehealth: Payer: Self-pay | Admitting: Neurology

## 2022-03-13 ENCOUNTER — Ambulatory Visit (INDEPENDENT_AMBULATORY_CARE_PROVIDER_SITE_OTHER): Payer: BC Managed Care – PPO | Admitting: Neurology

## 2022-03-13 VITALS — BP 117/76 | HR 66 | Ht 66.0 in | Wt 142.0 lb

## 2022-03-13 DIAGNOSIS — G43109 Migraine with aura, not intractable, without status migrainosus: Secondary | ICD-10-CM | POA: Diagnosis not present

## 2022-03-13 MED ORDER — NARATRIPTAN HCL 2.5 MG PO TABS: 2.5000 mg | ORAL_TABLET | ORAL | 5 refills | Status: DC | PRN

## 2022-03-13 NOTE — Patient Instructions (Addendum)
Recommend magnesium oxide 400 mg daily for migraine prevention, may add on Riboflavin 200 mg twice daily if magnesium alone is not enough  Try naratriptan for migraine relief  See you back in 6 months

## 2022-03-13 NOTE — Telephone Encounter (Signed)
Referral sent to [facility], phone # [phone].

## 2022-03-13 NOTE — Progress Notes (Signed)
Patient: Felicia Larsen Date of Birth: [birth year]  Reason for Visit: Follow up History from: Patient Primary Neurologist: [place]  ASSESSMENT AND PLAN 27 y.o. year old female   29.  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  Has about 3-4 migraines a month.  Triggers are stress, lack of sleep, spicy foods, bright light. She works in Therapist, art at a bank. Has to miss work due to migraines. Mostly develop after lunch, has to leave work early. Has had write ups. Doesn't want to be on preventative daily. A lot of stress with family, work. Is in school, going for cosmetology in April.   HISTORY  08/23/21 Dr. Billey Gosling: The patient presents for evaluation of headaches which began when she was a child. They have worsened over time. She previously saw Dr. Jannifer Franklin in 2019 for migraines and was treated with Topamax and Maxalt.   Currently she gets 1-2 migraines per month. They are described as frontal pressure with associated photophobia, phonophobia, and nausea. She will have a visual aura (stars in her vision) before her migraines. They can last 24 hours at a time.    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 Was supposed to follow up with neurology but never did.   Headache History: Onset:  childhood Triggers: stress, lack of sleep Aura: stars in her vision Location: frontal, unilateral Quality/Description: pressure Associated Symptoms:             Photophobia: yes             Phonophobia: yes             Nausea: yes Vomiting: yes Worse with activity?: yes Duration of headaches: 24 hours   Headache days per month: 2 Headache free days per month: 28   Current Treatment: Abortive Ibuprofen tylenol   Preventative none   Prior Therapies                                 Topamax - kidney stones Maxalt - drowsiness Imitrex  Tylenol ibuprofen  REVIEW OF SYSTEMS: Out of a complete 14 system review of symptoms, the patient complains only of the following symptoms, and all other reviewed systems are negative.  See HPI  ALLERGIES: Allergies  Allergen Reactions   Latex Itching and Rash    IRRITATION    HOME MEDICATIONS: Outpatient Medications Prior to Visit  Medication Sig Dispense Refill   fluconazole (DIFLUCAN) 150 MG tablet Take 1 tablet (150 mg total) by mouth daily. 2 tablet 0   No facility-administered medications prior to visit.    PAST MEDICAL HISTORY: Past Medical History:  Diagnosis Date   Anal fissure    Chlamydia 12/22/2013   Constipation  Headache(784.0)    Renal calculi    Renal disorder    Urinary tract infection    E.Coli resistant to Bactrim    PAST SURGICAL HISTORY: No past surgical history on file.  FAMILY HISTORY: Family History  Problem Relation Age of Onset   Kidney Stones Mother    GI problems Mother    Hypertension Mother    Migraines Maternal Grandmother    Heart disease Maternal Grandmother    Stroke Maternal Grandmother    Asthma Sister    Asthma Brother    Diabetes Paternal Grandmother    Migraines Paternal Grandfather    Asthma Brother    Allergic rhinitis Brother    Migraines Maternal Aunt    Migraines Maternal Uncle     SOCIAL HISTORY: Social History   Socioeconomic History   Marital status:  Single    Spouse name: Not on file   Number of children: 0   Years of education: 12   Highest education level: Not on file  Occupational History   Occupation: [employer]  Tobacco Use   Smoking status: Never   Smokeless tobacco: Never  Vaping Use   Vaping Use: Never used  Substance and Sexual Activity   Alcohol use: No    Alcohol/week: 0.0 standard drinks of alcohol   Drug use: No   Sexual activity: Yes    Birth control/protection: Condom    Comment: 3 to months ago.Marland Kitchen 10-16-15 2 days ago   Other Topics Concern   Not on file  Social History Narrative   Lives w/ mother   Caffeine use: none   Right handed    Social Determinants of Health   Financial Resource Strain: Low Risk  (02/13/2021)   Overall Financial Resource Strain (CARDIA)    Difficulty of Paying Living Expenses: Not very hard  Food Insecurity: No Food Insecurity (02/13/2021)   Hunger Vital Sign    Worried About Running Out of Food in the Last Year: Never true    Ran Out of Food in the Last Year: Never true  Transportation Needs: No Transportation Needs (02/13/2021)   PRAPARE - Hydrologist (Medical): No    Lack of Transportation (Non-Medical): No  Physical Activity: Not on file  Stress: Not on file  Social Connections: Not on file  Intimate Partner Violence: Not on file    PHYSICAL EXAM  Vitals:   03/13/22 1249  BP: 117/76  Pulse: 66  Weight: 142 lb (64.4 kg)  Height: 5' 6"$  (1.676 m)   Body mass index is 22.92 kg/m.  Generalized: Well developed, in no acute distress  Neurological examination  Mentation: Alert oriented to time, place, history taking. Follows all commands speech and language fluent Cranial nerve II-XII: Pupils were equal round reactive to light. Extraocular movements were full, visual field were full on confrontational test. Facial sensation and strength were normal. Head turning and shoulder shrug  were normal and symmetric. Motor: The motor testing reveals 5  over 5 strength of all 4 extremities. Good symmetric motor tone is noted throughout.  Sensory: Sensory testing is intact to soft touch on all 4 extremities. No evidence of extinction is noted.  Coordination: Cerebellar testing reveals good finger-nose-finger and heel-to-shin bilaterally.  Gait and station: Gait is normal.  Reflexes: Deep tendon reflexes are symmetric and normal bilaterally.   DIAGNOSTIC DATA (LABS, IMAGING, TESTING) - I reviewed patient records, labs, notes, testing and imaging myself where available.  Lab Results  Component Value Date  WBC 8.6 02/25/2016   HGB 11.5 (L) 02/25/2016   HCT 34.9 (L) 02/25/2016   MCV 82.9 02/25/2016   PLT 172 02/25/2016      Component Value Date/Time   NA 136 02/25/2016 1350   K 3.2 (L) 02/25/2016 1350   CL 107 02/25/2016 1350   CO2 22 02/25/2016 1350   GLUCOSE 90 02/25/2016 1350   BUN 8 02/25/2016 1350   CREATININE 1.24 (H) 02/25/2016 1350   CREATININE 0.72 04/07/2013 1305   CALCIUM 9.0 02/25/2016 1350   PROT 7.0 02/25/2016 1350   ALBUMIN 4.0 02/25/2016 1350   AST 19 02/25/2016 1350   ALT 13 (L) 02/25/2016 1350   ALKPHOS 77 02/25/2016 1350   BILITOT 0.6 02/25/2016 1350   GFRNONAA >60 02/25/2016 1350   GFRNONAA >89 04/07/2013 1305   GFRAA >60 02/25/2016 1350   GFRAA >89 04/07/2013 1305   Lab Results  Component Value Date   CHOL 111 04/07/2013   HDL 37 04/07/2013   LDLCALC 67 04/07/2013   TRIG 35 04/07/2013   CHOLHDL 3.0 04/07/2013   No results found for: "HGBA1C" No results found for: "VITAMINB12" Lab Results  Component Value Date   TSH 0.449 04/07/2013    Butler Denmark, AGNP-C, DNP 03/13/2022, 1:24 PM [facility] [address], [place], [place] [address] [phone]

## 2022-03-14 ENCOUNTER — Telehealth: Payer: Self-pay

## 2022-03-14 NOTE — Telephone Encounter (Signed)
LVM for patient to get fax number for [facility] form

## 2022-04-17 ENCOUNTER — Ambulatory Visit
Admission: EM | Admit: 2022-04-17 | Discharge: 2022-04-17 | Disposition: A | Discharge status: Home / Self Care | Payer: BC Managed Care – PPO | Attending: Urgent Care | Admitting: Urgent Care

## 2022-04-17 DIAGNOSIS — K644 Residual hemorrhoidal skin tags: Secondary | ICD-10-CM | POA: Diagnosis not present

## 2022-04-17 DIAGNOSIS — A6000 Herpesviral infection of urogenital system, unspecified: Secondary | ICD-10-CM | POA: Diagnosis not present

## 2022-04-17 DIAGNOSIS — B3731 Acute candidiasis of vulva and vagina: Secondary | ICD-10-CM | POA: Diagnosis not present

## 2022-04-17 MED ORDER — DOCUSATE SODIUM 100 MG PO CAPS: 100.0000 mg | ORAL_CAPSULE | Freq: Two times a day (BID) | ORAL | 0 refills | Status: DC

## 2022-04-17 MED ORDER — VALACYCLOVIR HCL 1 G PO TABS: ORAL_TABLET | ORAL | 5 refills | Status: DC

## 2022-04-17 MED ORDER — FLUCONAZOLE 150 MG PO TABS: 150.0000 mg | ORAL_TABLET | ORAL | 0 refills | Status: DC

## 2022-04-17 MED ORDER — HYDROCORTISONE (PERIANAL) 2.5 % EX CREA: 1.0000 | TOPICAL_CREAM | Freq: Two times a day (BID) | CUTANEOUS | 0 refills | Status: DC

## 2022-04-17 NOTE — ED Triage Notes (Signed)
Pt states vaginal itching,redness and soreness and white discharge for over a month. Also having rectal pain,history of hemorrhoids.

## 2022-04-17 NOTE — ED Provider Notes (Signed)
[facility]  Note:  This document was prepared using Systems analyst and may include unintentional dictation errors.  MRN: [id number] DOB: 11-08-1995  Subjective:   Felicia Larsen is a 27 y.o. female presenting for 1 month history of persistent vaginal itching, irritation, soreness, white discharge. Has had difficulty with yeast infections as of late.  Used to have trouble with bacterial vaginosis but now its yeast infection.  She has been seen twice in the past 4 months for this.  Her last visit in January was negative across the board for yeast, BV, chlamydia, gonorrhea, trichomoniasis.  Has a history of genital herpes and would like to be evaluated to see if she is having an outbreak.  Initially when she was diagnosed, it was confirmed through a viral culture and she had a blisterlike rash.  Does not have any of this today but still wants to be evaluated.  She has also had some rectal pain and would like to be evaluated for hemorrhoids.  Has associated chronic constipation.  Admits a poor diet.  Does not hydrate as well.  No current facility-administered medications for this encounter.  Current Outpatient Medications:    naratriptan (AMERGE) 2.5 MG tablet, Take 1 tablet (2.5 mg total) by mouth as needed for migraine. Take one (1) tablet at onset of headache; if returns or does not resolve, may repeat after 4 hours; do not exceed five (5) mg in 24 hours., Disp: 10 tablet, Rfl: 5   Allergies  Allergen Reactions   Latex Itching and Rash    IRRITATION    Past Medical History:  Diagnosis Date   Anal fissure    Chlamydia 12/22/2013   Constipation    Headache(784.0)    Renal calculi    Renal disorder    Urinary tract infection    E.Coli resistant to Bactrim     History reviewed. No pertinent surgical history.  Family History  Problem Relation Age of Onset   Kidney Stones Mother    GI problems Mother    Hypertension Mother     Migraines Maternal Grandmother    Heart disease Maternal Grandmother    Stroke Maternal Grandmother    Asthma Sister    Asthma Brother    Diabetes Paternal Grandmother    Migraines Paternal Grandfather    Asthma Brother    Allergic rhinitis Brother    Migraines Maternal Aunt    Migraines Maternal Uncle     Social History   Tobacco Use   Smoking status: Never   Smokeless tobacco: Never  Vaping Use   Vaping Use: Never used  Substance Use Topics   Alcohol use: No    Alcohol/week: 0.0 standard drinks of alcohol   Drug use: No    ROS   Objective:   Vitals: BP 100/63 (BP Location: Right Arm)   Pulse (!) 58   Temp 98.4 F (36.9 C) (Oral)   Resp 16   LMP 04/15/2022 (Exact Date)   SpO2 95%   Physical Exam Exam conducted with a chaperone present (CMA Demetrice).  Constitutional:      General: She is not in acute distress.    Appearance: Normal appearance. She is well-developed. She is not ill-appearing, toxic-appearing or diaphoretic.  HENT:     Head: Normocephalic and atraumatic.     Nose: Nose normal.     Mouth/Throat:     Mouth: Mucous membranes are moist.  Eyes:     General: No scleral icterus.  Right eye: No discharge.        Left eye: No discharge.     Extraocular Movements: Extraocular movements intact.     Conjunctiva/sclera: Conjunctivae normal.  Cardiovascular:     Rate and Rhythm: Normal rate.  Pulmonary:     Effort: Pulmonary effort is normal.  Abdominal:     General: Bowel sounds are normal. There is no distension.     Palpations: Abdomen is soft. There is no mass.     Tenderness: There is no abdominal tenderness. There is no right CVA tenderness, left CVA tenderness, guarding or rebound.  Genitourinary:   Skin:    General: Skin is warm and dry.  Neurological:     General: No focal deficit present.     Mental Status: She is alert and oriented to person, place, and time.  Psychiatric:        Mood and Affect: Mood normal.         Behavior: Behavior normal.        Thought Content: Thought content normal.        Judgment: Judgment normal.     Assessment and Plan :   PDMP not reviewed this encounter.  1. Yeast vaginitis   2. Genital herpes simplex, unspecified site   3. External hemorrhoids     Recommend starting oral fluconazole for 5 doses over the next 15 days.  Vaginal swab results pending, will treat as appropriate.  I did provide her with valacyclovir refill in the event she has her genital herpes outbreak.  Discussed management of hemorrhoids, constipation.  Use hydrocortisone rectal cream, start docusate stool softener.  Follow-up with [facility]. Counseled patient on potential for adverse effects with medications prescribed/recommended today, ER and return-to-clinic precautions discussed, patient verbalized understanding.    Jaynee Eagles, [place] 04/17/22 1119

## 2022-04-17 NOTE — Discharge Instructions (Addendum)
Start taking fluconazole for the next 2 weeks to address a persistent yeast infection of the vaginal area.  You can use valacyclovir to help against any particular herpes simplex virus breakout.  Follow the dosing instructions on this bottle, you will not go through the entire supply of medicine but is meant to be used as needed in the future for any outbreaks.  For the hemorrhoids, you can apply hydrocortisone rectal cream.  Do this twice daily for a week.  Follow-up with [facility] surgery for consultation regarding procedural removal.   For moderate to severe constipation (not having a bowel movement in more than 3 days) then try to use an enema or Miralax once daily until you have a good bowel movement.  It is not a good idea to use an enema or laxatives daily. If you find you are doing this, then please follow up with a gastroenterologist. Otherwise, a medication you could use daily to help with promoting bowel movements is docusate (Colace) 100mg . It is okay to use this 1-2 times daily as a stool softener.  Try to stay active physically including regular exercise 2-3 times a week.  Make sure you hydrate well every day with about 64 ounces of water daily (that is 2 liters).  Try to avoid carb heavy foods, dairy. This includes cutting out breads, pasta, pizza, pastries, potatoes, rice, starchy foods in general. Eat more fiber as listed below:  Salads - kale, spinach, cabbage, spring mix, arugula Fruits - avocadoes, berries (blueberries, raspberries, blackberries), apples, oranges, pomegranate, grapefruit, kiwi Vegetables - asparagus, cauliflower, broccoli, green beans, brussel sprouts, bell peppers, beets; stay away from or limit starchy vegetables like potatoes, carrots, peas Other general foods - kidney beans, egg whites, almonds, walnuts, sunflower seeds, pumpkin seeds, fat free yogurt, almond milk, flax seeds, quinoa, oats  Meat - It is better to eat lean meats and limit your red meat  including pork to once a week.  Wild caught fish, chicken breast are good options as they tend to be leaner sources of good protein. Still be mindful of the sodium labels for the meats you buy.  DO NOT EAT ANY FOODS ON THIS LIST THAT YOU ARE ALLERGIC TO. For more specific needs, I highly recommend consulting a dietician or nutritionist but this can definitely be a good starting point.

## 2022-04-18 LAB — CERVICOVAGINAL ANCILLARY ONLY
Bacterial Vaginitis (gardnerella): NEGATIVE
Candida Glabrata: NEGATIVE
Candida Vaginitis: POSITIVE — AB
Chlamydia: NEGATIVE
Comment: NEGATIVE
Comment: NEGATIVE
Comment: NEGATIVE
Comment: NEGATIVE
Comment: NEGATIVE
Comment: NORMAL
Neisseria Gonorrhea: NEGATIVE
Trichomonas: NEGATIVE

## 2022-04-23 ENCOUNTER — Encounter: Payer: Self-pay | Admitting: Neurology

## 2022-07-10 ENCOUNTER — Ambulatory Visit
Admission: EM | Admit: 2022-07-10 | Discharge: 2022-07-10 | Disposition: A | Discharge status: Home / Self Care | Payer: BC Managed Care – PPO | Attending: Urgent Care | Admitting: Urgent Care

## 2022-07-10 DIAGNOSIS — Z3A01 Less than 8 weeks gestation of pregnancy: Secondary | ICD-10-CM | POA: Diagnosis present

## 2022-07-10 DIAGNOSIS — O219 Vomiting of pregnancy, unspecified: Secondary | ICD-10-CM | POA: Diagnosis present

## 2022-07-10 DIAGNOSIS — N760 Acute vaginitis: Secondary | ICD-10-CM | POA: Insufficient documentation

## 2022-07-10 LAB — POCT URINE PREGNANCY: Preg Test, Ur: POSITIVE — AB

## 2022-07-10 MED ORDER — DOXYLAMINE-PYRIDOXINE 10-10 MG PO TBEC: 1.0000 | DELAYED_RELEASE_TABLET | Freq: Two times a day (BID) | ORAL | 0 refills | Status: DC

## 2022-07-10 MED ORDER — CLOTRIMAZOLE 1 % VA CREA: 1.0000 | TOPICAL_CREAM | Freq: Every day | VAGINAL | 0 refills | Status: DC

## 2022-07-10 NOTE — ED Provider Notes (Signed)
[facility]  Note:  This document was prepared using Conservation officer, historic buildings and may include unintentional dictation errors.  MRN: [id number] DOB: [birth year]  Subjective:   Felicia Larsen is a 27 y.o. female presenting for 1 month history of persistent vaginal discharge and itching.  Patient was seen in May 4 weeks ago, had her visit at a different practice.  Was confirmed to be pregnant.  They did also test her for yeast infection but did not prescribe anything.  Since then patient has gotten worse.  She is also started to have nausea and vomiting with her pregnancy.  Would like a complete cytology from the vaginal swab.  Denies fever, abdominal pain, pelvic pain, rashes, dysuria, urinary frequency, hematuria.  Her OB appointment is in 2 weeks to establish care.  Denies chronic medications.  She is taking a prenatal vitamin.  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  Family History  Problem Relation Age of Onset   Kidney Stones Mother    GI problems Mother    Hypertension Mother    Migraines Maternal Grandmother    Heart disease Maternal Grandmother    Stroke Maternal Grandmother    Asthma Sister    Asthma Brother    Diabetes Paternal Grandmother    Migraines Paternal Grandfather    Asthma Brother    Allergic rhinitis Brother    Migraines Maternal Aunt    Migraines Maternal Uncle     Social History   Tobacco Use   Smoking status: Never   Smokeless tobacco: Never  Vaping Use   Vaping Use: Never used  Substance Use Topics   Alcohol use: No    Alcohol/week: 0.0 standard drinks of alcohol   Drug use: No    ROS   Objective:   Vitals: BP 111/66  (BP Location: Left Arm)   Pulse 83   Temp 99 F (37.2 C) (Oral)   Resp 18   LMP 04/15/2022 (Exact Date)   SpO2 98%   Physical Exam Constitutional:      General: She is not in acute distress.    Appearance: Normal appearance. She is well-developed. She is not ill-appearing, toxic-appearing or diaphoretic.  HENT:     Head: Normocephalic and atraumatic.     Nose: Nose normal.     Mouth/Throat:     Mouth: Mucous membranes are moist.  Eyes:     General: No scleral icterus.       Right eye: No discharge.        Left eye: No discharge.     Extraocular Movements: Extraocular movements intact.  Cardiovascular:     Rate and Rhythm: Normal rate.  Pulmonary:     Effort: Pulmonary effort is normal.  Skin:    General: Skin is warm and dry.  Neurological:     General: No focal deficit present.     Mental Status: She is alert and oriented to person, place, and time.  Psychiatric:        Mood and Affect: Mood normal.        Behavior: Behavior normal.  Assessment and Plan :   PDMP not reviewed this encounter.  1. Acute vaginitis   2. [redacted] weeks gestation of pregnancy   3. Nausea and vomiting in pregnancy    Patient requesting empiric treatment and therefore I will be using clotrimazole vaginal cream to address the yeast vaginitis.  Patient requested Zofran but at this stage is too early to risk.  Recommended Diclegis.  Maintain prenatal vitamin.  Follow-up with OB/GYN. Counseled patient on potential for adverse effects with medications prescribed/recommended today, ER and return-to-clinic precautions discussed, patient verbalized understanding.    Wallis Bamberg, PA-C 07/10/22 1034

## 2022-07-10 NOTE — ED Triage Notes (Signed)
Pt reports vaginal discharge, malodorous vaginal discharge x 1 month; nausea x 5 weeks. Pt states she is 6-[redacted] weeks pregnant.   Pt has an appt with OBGYN on 07/20/22.

## 2022-07-12 LAB — CERVICOVAGINAL ANCILLARY ONLY
Bacterial Vaginitis (gardnerella): POSITIVE — AB
Candida Glabrata: NEGATIVE
Candida Vaginitis: NEGATIVE
Chlamydia: NEGATIVE
Comment: NEGATIVE
Comment: NEGATIVE
Comment: NEGATIVE
Comment: NEGATIVE
Comment: NEGATIVE
Comment: NORMAL
Neisseria Gonorrhea: NEGATIVE
Trichomonas: NEGATIVE

## 2022-07-15 ENCOUNTER — Telehealth (HOSPITAL_COMMUNITY): Payer: Self-pay | Admitting: Emergency Medicine

## 2022-07-15 MED ORDER — METRONIDAZOLE 0.75 % VA GEL: 1.0000 | Freq: Every day | VAGINAL | 0 refills | Status: AC

## 2022-08-19 LAB — OB RESULTS CONSOLE ABO/RH: RH Type: NEGATIVE

## 2022-08-19 LAB — OB RESULTS CONSOLE RPR: RPR: NONREACTIVE

## 2022-08-19 LAB — OB RESULTS CONSOLE HIV ANTIBODY (ROUTINE TESTING): HIV: NONREACTIVE

## 2022-08-19 LAB — OB RESULTS CONSOLE HEPATITIS B SURFACE ANTIGEN: Hepatitis B Surface Ag: NEGATIVE

## 2022-08-19 LAB — OB RESULTS CONSOLE ANTIBODY SCREEN: Antibody Screen: NEGATIVE

## 2022-08-19 LAB — OB RESULTS CONSOLE RUBELLA ANTIBODY, IGM: Rubella: IMMUNE

## 2022-08-19 LAB — HEPATITIS C ANTIBODY: HCV Ab: NEGATIVE

## 2022-09-09 LAB — OB RESULTS CONSOLE GBS: GBS: POSITIVE

## 2022-10-08 NOTE — Progress Notes (Unsigned)
Patient: Felicia Larsen Date of Birth: Dec 25, 1995  Reason for Visit: Follow up History from: Patient Primary Neurologist: [facility]  ASSESSMENT AND PLAN 27 y.o. year old female   1.  Chronic migraine headaches 2.  Current pregnancy, 21 weeks  Fortunately, migraines are under excellent control.  She has not had any significant headaches during pregnancy.  She is not on any medications.  She may take Tylenol if needed.  I did refer her again to psychiatry and gave her the phone number to discuss ongoing depression, also with her OB.  She will follow-up in our office on an as-needed basis.  If her headaches increase in the postpartum period.  She will schedule a follow-up.  HISTORY OF PRESENT ILLNESS: Today 10/09/22 Is currently 21 weeks pregnancy. Is her 1st pregnancy, having a girl. Pregnancy going well other than some depression. Less migraines since pregnant. Has not had any migraines since being pregnant only a few headaches took Tylenol with good benefit, never started magnesium. No one ever called her about psychiatry consult or counseling. Has FMLA for headaches.   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  Has about 3-4 migraines a month.  Triggers are stress, lack of sleep, spicy foods, bright light. 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 Has to miss work due to migraines. Mostly develop after lunch, has to leave work early. Has had write ups. Doesn't want to be on preventative daily. A lot of stress with family, work. Is in school, going for cosmetology in April.   HISTORY  08/23/21 Dr. Delena Bali: The patient presents for evaluation of headaches which began when she was a child. They have worsened over time. She previously saw Dr. Anne Hahn in 2019 for migraines and was treated with Topamax and Maxalt.   Currently she gets 1-2 migraines per month. They are described as frontal  pressure with associated photophobia, phonophobia, and nausea. She will have a visual aura (stars in her vision) before her migraines. They can last 24 hours at a time.    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 Was supposed to follow up with neurology but never did.   Headache History: Onset: childhood Triggers: stress, lack of sleep Aura: stars in her vision Location: frontal, unilateral Quality/Description: pressure Associated Symptoms:             Photophobia: yes             Phonophobia: yes             Nausea: yes Vomiting: yes Worse with activity?: yes Duration of headaches: 24 hours   Headache days per month: 2 Headache free days per month: 28   Current Treatment: Abortive Ibuprofen tylenol   Preventative none   Prior Therapies                                 Topamax - kidney stones Maxalt - drowsiness Imitrex  Tylenol ibuprofen  REVIEW OF SYSTEMS: Out of a complete 14 system review of symptoms, the patient complains only of the following symptoms, and all other reviewed systems are negative.  See HPI  ALLERGIES: Allergies  Allergen Reactions   Latex Itching and Rash    IRRITATION    HOME  MEDICATIONS: Outpatient Medications Prior to Visit  Medication Sig Dispense Refill   clotrimazole (GYNE-LOTRIMIN) 1 % vaginal cream Place 1 Applicatorful vaginally at bedtime. 45 g 0   Doxylamine-Pyridoxine (DICLEGIS) 10-10 MG TBEC Take 1 tablet by mouth 2 (two) times daily. 60 tablet 0   Prenatal Vit-Fe Fumarate-FA (PRENATAL MULTIVITAMIN) TABS tablet Take 1 tablet by mouth daily at 12 noon.     No facility-administered medications prior to visit.    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  FAMILY HISTORY: Family History  Problem Relation Age of Onset   Kidney Stones Mother    GI problems Mother    Hypertension Mother    Migraines Maternal Grandmother    Heart disease Maternal Grandmother    Stroke Maternal Grandmother    Asthma Sister    Asthma Brother    Diabetes Paternal Grandmother    Migraines Paternal Grandfather    Asthma Brother    Allergic rhinitis Brother    Migraines Maternal Aunt    Migraines Maternal Uncle     SOCIAL HISTORY: Social History   Socioeconomic History   Marital status: Single    Spouse name: Not on file   Number of children: 0   Years of education: 12   Highest education level: Not on file  Occupational History   Occupation: [employer]  Tobacco Use   Smoking status: Never   Smokeless tobacco: Never  Vaping Use   Vaping status: Never Used  Substance and Sexual Activity   Alcohol use: No    Alcohol/week: 0.0 standard drinks of alcohol   Drug use: Never   Sexual activity: Yes    Birth control/protection: Condom    Comment: 3 to months ago.Marland Kitchen 10-16-15 2 days ago   Other Topics Concern   Not on file  Social History Narrative   Lives w/ mother   Caffeine use: none   Right handed    Social Determinants of Health   Financial Resource Strain: Low Risk  (02/13/2021)   Overall Financial Resource Strain (CARDIA)    Difficulty of Paying Living Expenses: Not very hard  Food Insecurity: No Food Insecurity (02/13/2021)   Hunger Vital Sign    Worried About Running Out of Food in the Last Year: Never true    Ran Out of Food in the Last Year: Never true  Transportation Needs: No Transportation Needs (02/13/2021)   PRAPARE - Administrator, Civil Service (Medical): No    Lack of Transportation (Non-Medical): No  Physical Activity: Not on file  Stress: Not on file  Social Connections: Not on file  Intimate Partner Violence: Not on file   PHYSICAL EXAM  Vitals:   10/09/22 0926   BP: 104/65  Pulse: 84  Weight: 158 lb 8 oz (71.9 kg)  Height: 5\' 6"  (1.676 m)   Body mass index is 25.58 kg/m.  Generalized: Well developed, in no acute distress  Neurological examination  Mentation: Alert oriented to time, place, history taking. Follows all commands speech and language fluent Cranial nerve II-XII: Pupils were equal round reactive to light. Extraocular movements were full, visual field were full on confrontational test. Facial sensation and strength were normal. Head turning  and shoulder shrug  were normal and symmetric. Motor: The motor testing reveals 5 over 5 strength of all 4 extremities. Good symmetric motor tone is noted throughout.  Sensory: Sensory testing is intact to soft touch on all 4 extremities. No evidence of extinction is noted.  Coordination: Cerebellar testing reveals good finger-nose-finger and heel-to-shin bilaterally.  Gait and station: Gait is normal.  Reflexes: Deep tendon reflexes are symmetric and normal bilaterally.   DIAGNOSTIC DATA (LABS, IMAGING, TESTING) - I reviewed patient records, labs, notes, testing and imaging myself where available.  Lab Results  Component Value Date   WBC 8.6 02/25/2016   HGB 11.5 (L) 02/25/2016   HCT 34.9 (L) 02/25/2016   MCV 82.9 02/25/2016   PLT 172 02/25/2016      Component Value Date/Time   NA 136 02/25/2016 1350   K 3.2 (L) 02/25/2016 1350   CL 107 02/25/2016 1350   CO2 22 02/25/2016 1350   GLUCOSE 90 02/25/2016 1350   BUN 8 02/25/2016 1350   CREATININE 1.24 (H) 02/25/2016 1350   CREATININE 0.72 04/07/2013 1305   CALCIUM 9.0 02/25/2016 1350   PROT 7.0 02/25/2016 1350   ALBUMIN 4.0 02/25/2016 1350   AST 19 02/25/2016 1350   ALT 13 (L) 02/25/2016 1350   ALKPHOS 77 02/25/2016 1350   BILITOT 0.6 02/25/2016 1350   GFRNONAA >60 02/25/2016 1350   GFRNONAA >89 04/07/2013 1305   GFRAA >60 02/25/2016 1350   GFRAA >89 04/07/2013 1305   Lab Results  Component Value Date   CHOL 111 04/07/2013    HDL 37 04/07/2013   LDLCALC 67 04/07/2013   TRIG 35 04/07/2013   CHOLHDL 3.0 04/07/2013   No results found for: "HGBA1C" No results found for: "VITAMINB12" Lab Results  Component Value Date   TSH 0.449 04/07/2013    Margie Ege, AGNP-C, DNP 10/09/2022, 9:54 AM [facility] [address] [place], [place] [address] [phone]

## 2022-10-09 ENCOUNTER — Ambulatory Visit (INDEPENDENT_AMBULATORY_CARE_PROVIDER_SITE_OTHER): Payer: Medicaid Other | Admitting: Neurology

## 2022-10-09 ENCOUNTER — Encounter: Payer: Self-pay | Admitting: Neurology

## 2022-10-09 VITALS — BP 104/65 | HR 84 | Ht 66.0 in | Wt 158.5 lb

## 2022-10-09 DIAGNOSIS — G43109 Migraine with aura, not intractable, without status migrainosus: Secondary | ICD-10-CM | POA: Diagnosis not present

## 2022-11-25 ENCOUNTER — Ambulatory Visit (INDEPENDENT_AMBULATORY_CARE_PROVIDER_SITE_OTHER): Payer: Medicaid Other | Admitting: Clinical

## 2022-11-25 DIAGNOSIS — F331 Major depressive disorder, recurrent, moderate: Secondary | ICD-10-CM

## 2022-11-25 DIAGNOSIS — F419 Anxiety disorder, unspecified: Secondary | ICD-10-CM
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
## 2022-11-26 ENCOUNTER — Encounter (HOSPITAL_COMMUNITY): Payer: Self-pay | Admitting: Clinical

## 2022-11-26 ENCOUNTER — Encounter (HOSPITAL_COMMUNITY): Payer: Self-pay

## 2022-12-06 ENCOUNTER — Ambulatory Visit: Payer: BC Managed Care – PPO | Admitting: Cardiology

## 2023-01-02 ENCOUNTER — Encounter (HOSPITAL_COMMUNITY): Payer: Self-pay | Admitting: Clinical

## 2023-01-02 ENCOUNTER — Ambulatory Visit (INDEPENDENT_AMBULATORY_CARE_PROVIDER_SITE_OTHER): Payer: Medicaid Other | Admitting: Clinical

## 2023-01-02 DIAGNOSIS — F419 Anxiety disorder, unspecified: Secondary | ICD-10-CM

## 2023-01-02 DIAGNOSIS — F331 Major depressive disorder, recurrent, moderate: Secondary | ICD-10-CM | POA: Diagnosis not present
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
## 2023-01-06 ENCOUNTER — Ambulatory Visit: Payer: BC Managed Care – PPO | Admitting: Cardiology

## 2023-01-17 ENCOUNTER — Ambulatory Visit: Payer: Medicaid Other | Attending: Cardiology | Admitting: Cardiology

## 2023-01-29 NOTE — L&D Delivery Note (Signed)
OB/GYN [facility] Delivery Note  Felicia Larsen is a 28 y.o. G1P0 s/p VD at [redacted]w[redacted]d. She was admitted for IOL 2/2 pdIOL.   ROM: 14h 51m with clear to bloody fluid GBS Status:  Positive/-- (08/12 0000), PCN ppx Maximum Maternal Temperature: 99.1  Labor Progress: Initial SVE: closed/50/-3. She then progressed to complete.   Delivery Date/Time: 02/23/23 @0019  Delivery: Called to room and patient was complete and pushing. Head delivered slowly over the perineum LOA.   0019- The right shoulder was noted to be impacted behind maternal pubic bone.  No nuchal cord present.  0019- Suprapubic pressure and McRoberts maneuvers utilize without shoulder release 0019- Woods screw attempted followed by posterior arm delivery. Total shoulder dystocia time 20 seconds.   The infant did not have spontaneous cry following placement on mother's abdomen and stimulation. Code blue was called and the infant was taken to warmer to be assessed by the NICU team. A cord gas was collected. Cord blood drawn. Placenta delivered spontaneously with gentle cord traction. Fundus firm with massage and Pitocin. Labia, perineum, vagina, and cervix inspected. There were no lacerations. The infant was returned to skin to skin. Please appreciate neonatology consult note.  Baby Weight: pending  Placenta: 3 vessel, intact. Sent to L&D Complications: 20 second shoulder dystocia Lacerations: None EBL: 125 mL Analgesia: Epidural pH: 7.22  Infant:  APGAR (1 MIN): 6  APGAR (5 MINS): 9  Daria Mcmeekin Autry-Lott, DO 02/23/2023, 12:44 AM

## 2023-02-10 ENCOUNTER — Ambulatory Visit (HOSPITAL_COMMUNITY): Payer: Medicaid Other | Admitting: Clinical

## 2023-02-13 ENCOUNTER — Telehealth (HOSPITAL_COMMUNITY): Payer: Self-pay | Admitting: *Deleted

## 2023-02-13 ENCOUNTER — Encounter (HOSPITAL_COMMUNITY): Payer: Self-pay | Admitting: *Deleted

## 2023-02-13 ENCOUNTER — Other Ambulatory Visit: Payer: Self-pay | Admitting: Obstetrics and Gynecology

## 2023-02-13 NOTE — Telephone Encounter (Signed)
Preadmission screen  

## 2023-02-14 ENCOUNTER — Encounter (HOSPITAL_COMMUNITY): Payer: Self-pay | Admitting: *Deleted

## 2023-02-17 ENCOUNTER — Ambulatory Visit (INDEPENDENT_AMBULATORY_CARE_PROVIDER_SITE_OTHER): Payer: Medicaid Other | Admitting: Clinical

## 2023-02-17 ENCOUNTER — Encounter (HOSPITAL_COMMUNITY): Payer: Self-pay | Admitting: Clinical

## 2023-02-17 DIAGNOSIS — F419 Anxiety disorder, unspecified: Secondary | ICD-10-CM | POA: Diagnosis not present

## 2023-02-17 DIAGNOSIS — F331 Major depressive disorder, recurrent, moderate: Secondary | ICD-10-CM

## 2023-02-17 NOTE — Progress Notes (Unsigned)
THERAPIST PROGRESS NOTE  Session Time: 8:02-9:00am  Session #3  Virtual Visit via Video Note  I connected with Felicia Larsen on 02/17/23 at  8:00 AM EST by a video enabled telemedicine application and verified that I am speaking with the correct person using two identifiers.  Location: Patient: home Provider: [facility] outpatient therapy office   I discussed the limitations of evaluation and management by telemedicine and the availability of in person appointments. The patient expressed understanding and agreed to proceed.   I discussed the assessment and treatment plan with the patient. The patient was provided an opportunity to ask questions and all were answered. The patient agreed with the plan and demonstrated an understanding of the instructions.   The patient was advised to call back or seek an in-person evaluation if the symptoms worsen or if the condition fails to improve as anticipated.  I provided 58 minutes of non-face-to-face time during this encounter.  Lynnell Chad, LCSW    Participation Level: Active  Behavioral Response: Casual Alert Negative and Euthymic  Type of Therapy: Individual Therapy  Treatment Goals addressed: *** Goal: LTG:  Score less than 5 on the GAD-7 as evidenced by intermittent administration of the questionnaire to determine progress in management of anxiety.    Goal: STG:  Learn about boundary types, how to implement them, and how to enforce them so that patient feels more empowered and content with being able to maintain more helpful, appropriate boundaries in the future for a more balanced result.    Goal: LTG: Learn and practice communication techniques such as "I" statements, open-ended questions, reflective listening, assertiveness, fair fighting rules, initiating conversations, and more as necessary and taught in session    Goal: STG: Learn about the feeling of anxiety and its many variations, the cycle of anxiety and how to  interrupt that cycle.  Goal: LTG: Implement daily relaxation and mindfulness practice  Learn breathing techniques and grounding techniques at an age-appropriate level and demonstrate mastery in session then report independent use of these skills out of session.  Goal: LTG: Score less than 9 on the Patient Health Questionnaire (PHQ-9) as evidenced by intermittent administration of the questionnaire to determine progress.   Goal: STG: Patient will process life events to the extent needed so that patient is able to move forward with various areas of life in a better frame of mind per self-report   Goal: LTG: Learn a variety of coping skills and demonstrate the ability to use them to decrease feelings of sadness, anger, and fear and increase feelings of happiness, peace, and powerfulness AEB gauging those emotions on 1-10 scale.    Goal: STG: Identify and decrease cognitive distortions contributing negatively to mood and behavior by identifying 5-7 cognitive distortions patient has and learning how to come up with replacement thoughts that are more balanced, realistic, and helpful.  ProgressTowards Goals: Progressing  Interventions: Supportive, Psychologist, occupational, and Other: Therapist, art  ***  Summary: Felicia Larsen is a 28 y.o. female who presents with depression, communication issues with father of her unborn child, anxiety, anger problems, a desire to be a good parent, being referred from her neurologist whom she sees for migraines.  ***e presented oriented x5 and stated ***he was feeling "***."  CSW evaluated patient's medication compliance, use of coping tools, and self-care, as applicable.   ***  Suicidal/Homicidal: No without intent/plan  Therapist Response: Patient is progressing AEB engaging in scheduled therapy session.  Throughout the session, CSW gave  patient the opportunity to explore thoughts and feelings associated with current life situations and past/present  stressors.   CSW challenged patient gently and appropriately to consider different ways of looking at reported issues. CSW encouraged patient's expression of feelings and validated these using empathy, active listening, open body language, and unconditional positive regard.   CSW encouraged patient to schedule more therapy sessions for the future, as needed.  She is interested in possibly attending a few groups between now and next scheduled individual session.  Recommendations:  Return to therapy in 5-6 weeks, engage in self care behaviors as explored in session, consider group therapy until next appointment, ***  Plan: Return again at next appointment on 04/11/23.  Diagnosis: Major depressive disorder, recurrent episode, moderate degree (HCC)  Anxiety disorder, unspecified type  Collaboration of Care: Other - refuses medication at this time, [facility] providers can see that patient is in therapy but likely cannot read the notes  Patient/Guardian was advised Release of Information must be obtained prior to any record release in order to collaborate their care with an outside provider. Patient/Guardian was advised if they have not already done so to contact the registration department to sign all necessary forms in order for [place] to release information regarding their care.   Consent: Patient/Guardian gives verbal consent for treatment and assignment of benefits for services provided during this visit. Patient/Guardian expressed understanding and agreed to proceed.   Lynnell Chad, LCSW 02/17/2023

## 2023-02-20 ENCOUNTER — Other Ambulatory Visit: Payer: Self-pay | Admitting: Obstetrics and Gynecology

## 2023-02-21 ENCOUNTER — Encounter (HOSPITAL_COMMUNITY): Payer: Self-pay | Admitting: Obstetrics and Gynecology

## 2023-02-21 ENCOUNTER — Inpatient Hospital Stay (HOSPITAL_COMMUNITY): Payer: Medicaid Other

## 2023-02-21 ENCOUNTER — Inpatient Hospital Stay (HOSPITAL_COMMUNITY)
Admission: RE | Admit: 2023-02-21 | Discharge: 2023-02-24 | DRG: 806 | Disposition: A | Discharge status: Home / Self Care | Payer: Medicaid Other | Attending: Obstetrics and Gynecology | Admitting: Obstetrics and Gynecology

## 2023-02-21 ENCOUNTER — Other Ambulatory Visit: Payer: Self-pay

## 2023-02-21 DIAGNOSIS — Z833 Family history of diabetes mellitus: Secondary | ICD-10-CM | POA: Diagnosis not present

## 2023-02-21 DIAGNOSIS — O480 Post-term pregnancy: Principal | ICD-10-CM | POA: Diagnosis present

## 2023-02-21 DIAGNOSIS — Z91040 Latex allergy status: Secondary | ICD-10-CM | POA: Diagnosis not present

## 2023-02-21 DIAGNOSIS — A6000 Herpesviral infection of urogenital system, unspecified: Secondary | ICD-10-CM | POA: Diagnosis present

## 2023-02-21 DIAGNOSIS — O26893 Other specified pregnancy related conditions, third trimester: Secondary | ICD-10-CM | POA: Diagnosis present

## 2023-02-21 DIAGNOSIS — Z6791 Unspecified blood type, Rh negative: Secondary | ICD-10-CM

## 2023-02-21 DIAGNOSIS — O99824 Streptococcus B carrier state complicating childbirth: Secondary | ICD-10-CM | POA: Diagnosis present

## 2023-02-21 DIAGNOSIS — Z23 Encounter for immunization: Secondary | ICD-10-CM

## 2023-02-21 DIAGNOSIS — O9832 Other infections with a predominantly sexual mode of transmission complicating childbirth: Secondary | ICD-10-CM | POA: Diagnosis present

## 2023-02-21 DIAGNOSIS — Z3A41 41 weeks gestation of pregnancy: Secondary | ICD-10-CM | POA: Diagnosis not present

## 2023-02-21 DIAGNOSIS — Z8249 Family history of ischemic heart disease and other diseases of the circulatory system: Secondary | ICD-10-CM | POA: Diagnosis not present

## 2023-02-21 LAB — CBC
HCT: 37 % (ref 36.0–46.0)
Hemoglobin: 12 g/dL (ref 12.0–15.0)
MCH: 27.7 pg (ref 26.0–34.0)
MCHC: 32.4 g/dL (ref 30.0–36.0)
MCV: 85.5 fL (ref 80.0–100.0)
Platelets: 176 10*3/uL (ref 150–400)
RBC: 4.33 MIL/uL (ref 3.87–5.11)
RDW: 14.6 % (ref 11.5–15.5)
WBC: 7.3 10*3/uL (ref 4.0–10.5)
nRBC: 0 % (ref 0.0–0.2)

## 2023-02-21 LAB — TYPE AND SCREEN
ABO/RH(D): B NEG
Antibody Screen: POSITIVE

## 2023-02-21 MED ORDER — SODIUM CHLORIDE 0.9 % IV SOLN
5.0000 10*6.[IU] | Freq: Once | INTRAVENOUS | Status: AC
  Administered 2023-02-21: 5 10*6.[IU] via INTRAVENOUS
  Filled 2023-02-21: qty 5

## 2023-02-21 MED ORDER — LACTATED RINGERS IV SOLN: INTRAVENOUS | Status: AC

## 2023-02-21 MED ORDER — OXYTOCIN-SODIUM CHLORIDE 30-0.9 UT/500ML-% IV SOLN
2.5000 [IU]/h | INTRAVENOUS | Status: DC
  Administered 2023-02-23: 2.5 [IU]/h via INTRAVENOUS
  Filled 2023-02-21: qty 500

## 2023-02-21 MED ORDER — LACTATED RINGERS IV SOLN: 500.0000 mL | INTRAVENOUS | Status: AC | PRN

## 2023-02-21 MED ORDER — LIDOCAINE HCL (PF) 1 % IJ SOLN: 30.0000 mL | INTRAMUSCULAR | Status: DC | PRN

## 2023-02-21 MED ORDER — PENICILLIN G POT IN DEXTROSE 60000 UNIT/ML IV SOLN
3.0000 10*6.[IU] | INTRAVENOUS | Status: DC
  Administered 2023-02-22 (×6): 3 10*6.[IU] via INTRAVENOUS
  Filled 2023-02-21 (×6): qty 50

## 2023-02-21 MED ORDER — MISOPROSTOL 25 MCG QUARTER TABLET
25.0000 ug | ORAL_TABLET | Freq: Once | ORAL | Status: AC
  Administered 2023-02-21: 25 ug via VAGINAL
  Filled 2023-02-21: qty 1

## 2023-02-21 MED ORDER — ACETAMINOPHEN 325 MG PO TABS: 650.0000 mg | ORAL_TABLET | ORAL | Status: DC | PRN

## 2023-02-21 MED ORDER — FLEET ENEMA RE ENEM: 1.0000 | ENEMA | RECTAL | Status: DC | PRN

## 2023-02-21 MED ORDER — MISOPROSTOL 25 MCG QUARTER TABLET
25.0000 ug | ORAL_TABLET | Freq: Once | ORAL | Status: AC
  Administered 2023-02-21: 25 ug via ORAL
  Filled 2023-02-21: qty 1

## 2023-02-21 MED ORDER — OXYTOCIN BOLUS FROM INFUSION
333.0000 mL | Freq: Once | INTRAVENOUS | Status: AC
  Administered 2023-02-23: 333 mL via INTRAVENOUS

## 2023-02-21 MED ORDER — SOD CITRATE-CITRIC ACID 500-334 MG/5ML PO SOLN
30.0000 mL | ORAL | Status: DC | PRN
  Administered 2023-02-21: 30 mL via ORAL
  Filled 2023-02-21: qty 30

## 2023-02-21 MED ORDER — FENTANYL CITRATE (PF) 100 MCG/2ML IJ SOLN
50.0000 ug | INTRAMUSCULAR | Status: DC | PRN
Start: 2023-02-21 — End: 2023-02-23
  Administered 2023-02-22: 100 ug via INTRAVENOUS
  Administered 2023-02-22: 50 ug via INTRAVENOUS
  Filled 2023-02-21 (×2): qty 2

## 2023-02-21 MED ORDER — ONDANSETRON HCL 4 MG/2ML IJ SOLN
4.0000 mg | Freq: Four times a day (QID) | INTRAMUSCULAR | Status: DC | PRN
  Filled 2023-02-21: qty 2

## 2023-02-21 MED ORDER — TERBUTALINE SULFATE 1 MG/ML IJ SOLN
0.2500 mg | Freq: Once | INTRAMUSCULAR | Status: DC | PRN
Start: 2023-02-21 — End: 2023-02-23

## 2023-02-21 NOTE — H&P (Cosign Needed Addendum)
OB ADMISSION/ HISTORY & PHYSICAL:  Admission Date: 02/21/2023  7:54 PM  Admit Diagnosis: Normal labor  Felicia Larsen is a 28 y.o. female G1P0 [redacted]w[redacted]d presenting for induction of labor at 41 weeks. Endorses active FM, denies LOF and vaginal bleeding.   History of current pregnancy: G1P0   Patient entered care with [facility] at 14+3 wks.   EDC 02/25/23 by LMP and congruent w/ 1st trimester U/S.   Anatomy scan:  complete w/ posterior placenta.   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    Prenatal Labs: ABO, Rh: B/Negative/-- (07/22 0000) Antibody: Negative (07/22 0000) Rubella: Immune (07/22 0000)  RPR: Nonreactive (07/22 0000)  HBsAg: Negative (07/22 0000)  HIV: Non-reactive (07/22 0000)  GTT: passed 1 hr GBS: Positive/-- (08/12 0000)  GC/CHL: neg/neg Genetics: low-risk Vaccines: Tdap: declined Influenza: declined   OB History  Gravida Para Term Preterm AB Living  1       SAB IAB Ectopic Multiple Live Births          # Outcome Date GA Lbr Len/2nd Weight Sex Type Anes PTL Lv  1 Current             Medical / Surgical History: Past medical history:  Past Medical History:  Diagnosis Date   Anal fissure    Chlamydia 12/22/2013   Constipation    Headache(784.0)    Renal calculi    Renal disorder    Urinary tract infection    E.Coli resistant to Bactrim    Past surgical history: No past surgical history on file. Family History:  Family History  Problem Relation Age of Onset   Kidney Stones Mother    GI problems Mother    Hypertension Mother    Migraines Maternal Grandmother    Heart disease Maternal Grandmother    Stroke Maternal Grandmother    Asthma Sister    Asthma Brother    Diabetes Paternal Grandmother    Migraines Paternal Grandfather    Asthma Brother    Allergic  rhinitis Brother    Migraines Maternal Aunt    Migraines Maternal Uncle     Social History:  reports that she has never smoked. She has never used smokeless tobacco. She reports that she does not drink alcohol and does not use drugs.  Allergies: Latex   Current Medications at time of admission:  Prior to Admission medications   Medication Sig Start Date End Date Taking? Authorizing Provider  clotrimazole (GYNE-LOTRIMIN) 1 % vaginal cream Place 1 Applicatorful vaginally at bedtime. 07/10/22   Wallis Bamberg, PA-C  Doxylamine-Pyridoxine (DICLEGIS) 10-10 MG TBEC Take 1 tablet by mouth 2 (two) times daily. 07/10/22   Wallis Bamberg, PA-C  Prenatal Vit-Fe Fumarate-FA (PRENATAL MULTIVITAMIN) TABS tablet Take 1 tablet by mouth daily at 12 noon.    [provider]    Review of Systems: Constitutional: Negative   HENT: Negative   Eyes: Negative   Respiratory: Negative   Cardiovascular: Negative   Gastrointestinal: Negative  Genitourinary: neg for bloody show, neg for LOF   Musculoskeletal: Negative   Skin: Negative   Neurological: Negative   Endo/Heme/Allergies: Negative   Psychiatric/Behavioral: Negative    Physical Exam: VS: Last menstrual period 04/15/2022.  AAO x3, no signs of distress Cardiovascular: RRR Respiratory: Unlabored GU/GI: Abdomen gravid, non-tender, non-distended, active  FM, vertex Extremities: no edema, negative for pain, tenderness, and cords  Cervical exam:Dilation: Closed Effacement (%): 50 Station: -3 Exam by:: Beckey Rutter, CNM FHR: baseline rate 150 / variability moderate / accelerations present / absent decelerations TOCO: irreg   Prenatal Transfer Tool  Maternal Diabetes: No Genetic Screening: Normal Maternal Ultrasounds/Referrals: Normal Fetal Ultrasounds or other Referrals:  None Maternal Substance Abuse:  No Significant Maternal Medications:  Valtrex since 36 wks, but has not taken as directed. Negative speculum exam Significant Maternal Lab  Results: Group B Strep positive and Rh negative Number of Prenatal Visits:greater than 3 verified prenatal visits Maternal Vaccinations: declined all vaccines Other Comments:  None    Assessment: 28 y.o. G1P0 [redacted]w[redacted]d  Induction of labor at 41 weeks FHR category 1 GBS positive Pain management plan: per pt's request HSV  Plan:  Admit to L&D Routine admission orders Epidural PRN PCN for GBS prophylaxis Negative speculum exam and no prodromal symptoms Dr Connye Burkitt notified of admission and plan of care  Roma Schanz DNP, CNM 02/21/2023 8:28 PM

## 2023-02-22 ENCOUNTER — Inpatient Hospital Stay (HOSPITAL_COMMUNITY): Payer: Medicaid Other | Admitting: Anesthesiology

## 2023-02-22 LAB — RPR: RPR Ser Ql: NONREACTIVE

## 2023-02-22 MED ORDER — DIPHENHYDRAMINE HCL 50 MG/ML IJ SOLN: 12.5000 mg | INTRAMUSCULAR | Status: DC | PRN

## 2023-02-22 MED ORDER — LACTATED RINGERS IV SOLN: 500.0000 mL | Freq: Once | INTRAVENOUS | Status: DC

## 2023-02-22 MED ORDER — MISOPROSTOL 50MCG HALF TABLET
50.0000 ug | ORAL_TABLET | Freq: Once | ORAL | Status: AC
  Administered 2023-02-22: 50 ug via VAGINAL
  Filled 2023-02-22: qty 1

## 2023-02-22 MED ORDER — PHENYLEPHRINE 80 MCG/ML (10ML) SYRINGE FOR IV PUSH (FOR BLOOD PRESSURE SUPPORT)
80.0000 ug | PREFILLED_SYRINGE | INTRAVENOUS | Status: DC | PRN
  Filled 2023-02-22: qty 10

## 2023-02-22 MED ORDER — EPHEDRINE 5 MG/ML INJ: 10.0000 mg | INTRAVENOUS | Status: DC | PRN

## 2023-02-22 MED ORDER — FENTANYL-BUPIVACAINE-NACL 0.5-0.125-0.9 MG/250ML-% EP SOLN
12.0000 mL/h | EPIDURAL | Status: DC | PRN
  Administered 2023-02-22: 12 mL/h via EPIDURAL
  Filled 2023-02-22: qty 250

## 2023-02-22 MED ORDER — FENTANYL-BUPIVACAINE-NACL 0.5-0.125-0.9 MG/250ML-% EP SOLN: 12.0000 mL/h | EPIDURAL | Status: DC | PRN

## 2023-02-22 MED ORDER — LIDOCAINE HCL (PF) 1 % IJ SOLN
INTRAMUSCULAR | Status: DC | PRN
  Administered 2023-02-22: 8 mL via EPIDURAL

## 2023-02-22 MED ORDER — PHENYLEPHRINE 80 MCG/ML (10ML) SYRINGE FOR IV PUSH (FOR BLOOD PRESSURE SUPPORT): 80.0000 ug | PREFILLED_SYRINGE | INTRAVENOUS | Status: DC | PRN

## 2023-02-22 MED ORDER — PHENYLEPHRINE 80 MCG/ML (10ML) SYRINGE FOR IV PUSH (FOR BLOOD PRESSURE SUPPORT)
80.0000 ug | PREFILLED_SYRINGE | INTRAVENOUS | Status: DC | PRN
  Administered 2023-02-22: 80 ug via INTRAVENOUS

## 2023-02-22 MED ORDER — BENZOCAINE-MENTHOL 20-0.5 % EX AERO
1.0000 | INHALATION_SPRAY | Freq: Four times a day (QID) | CUTANEOUS | Status: DC | PRN
  Filled 2023-02-22: qty 56

## 2023-02-22 MED ORDER — TERBUTALINE SULFATE 1 MG/ML IJ SOLN: 0.2500 mg | Freq: Once | INTRAMUSCULAR | Status: DC | PRN

## 2023-02-22 MED ORDER — OXYTOCIN-SODIUM CHLORIDE 30-0.9 UT/500ML-% IV SOLN
1.0000 m[IU]/min | INTRAVENOUS | Status: DC
  Administered 2023-02-22: 1 m[IU]/min via INTRAVENOUS

## 2023-02-22 NOTE — Progress Notes (Signed)
Subjective:    C/O lower back pain with contractions, using IV pain meds, and plans to have an epidural. Discussed cervical balloon placement and pt agrees.   Objective:    VS: BP 129/67   Pulse 61   Temp 98.1 F (36.7 C) (Oral)   Resp 15   Ht 5\' 6"  (1.676 m)   Wt 88.5 kg   LMP 04/15/2022 (Exact Date)   BMI 31.51 kg/m  FHR : baseline 140 / variability moderate / accelerations present / variable decelerations Toco: contractions irreg Membranes: intact Dilation: Closed Effacement (%): 50 Station: -3 Presentation: Vertex Exam by:: Ulis Rias, RN   Assessment/Plan:   28 y.o. G1P0 [redacted]w[redacted]d IOL for dates  Labor: Cytotec 25 mcg buccal and 25 mcg vaginal x1, followed by 50 mcg vaginal 4 hrs later, Cook balloon inserted w/ 80 mL uterine and 60 mL vaginal. Pt tolerated placement well Fetal Wellbeing:  Category I with periods of Cat 2 Pain Control:   per pt's request I/D:   GBS pos, receiving PCN prophylaxis Anticipated MOD:  NSVD  Roma Schanz DNP, CNM 02/22/2023 4:59 AM

## 2023-02-22 NOTE — Progress Notes (Signed)
Labor Progress Note Felicia Larsen is a 28 y.o. G1P0 at [redacted]w[redacted]d presented for IOL 2/2 PD.   S: Having some discomfort with contractions that appear to be 5 mins apart. She is breathing through them.   O:  BP (!) 134/55   Pulse 63   Temp 98.1 F (36.7 C) (Oral)   Resp 15   Ht 5\' 6"  (1.676 m)   Wt 88.5 kg   LMP 04/15/2022 (Exact Date)   BMI 31.51 kg/m  EFM: 140bpm/min to moderate/+accels, occasional late decel  CVE: Dilation: 5.5 Effacement (%): 80 Station: -2 Presentation: Vertex Exam by:: Autry-Lott   A&P: 28 y.o. G1P0 [redacted]w[redacted]d here for PDIOL.  #Labor: Progressing well. S/p cytotec and cook cath. AROM clear to bloody fluid. Consider pitocin when appropriate.  #Pain: Maternally supported #FWB:Cat I with periods of Cat II currently but overall reassuring. Receiving IVF bolus now. Continue to monitor and optimize maternal positioning. #GBS positive, PCN ppx  Rh neg -Rhogam work up [employer], DO 10:06 AM

## 2023-02-22 NOTE — Progress Notes (Signed)
Labor Progress Note Felicia Larsen is a 28 y.o. G1P0 at [redacted]w[redacted]d presented for IOL 2/2 PD.   S: Doing well no acute concerns. Having more pelvic discomfort.   O:  BP 127/81   Pulse (!) 107   Temp 98.3 F (36.8 C) (Oral)   Resp 15   Ht 5\' 6"  (1.676 m)   Wt 88.5 kg   LMP 04/15/2022 (Exact Date)   SpO2 99%   BMI 31.51 kg/m  EFM: 140bpm/min to moderate/+accels, no decel  CVE: Dilation: 8.5 Effacement (%): 90 Station: Plus 1 Presentation: Vertex Exam by:: Hipolito Bayley, RN   A&P: 28 y.o. G1P0 [redacted]w[redacted]d here for PDIOL.  #Labor: Progressing well on pitocin 1x1. Continue current management.  #Pain: Epidural #FWB:Cat I  #GBS positive, PCN ppx  Rh neg -Rhogam work up [employer], DO 8:51 PM

## 2023-02-22 NOTE — Anesthesia Preprocedure Evaluation (Signed)
Anesthesia Evaluation  Patient identified by MRN, date of birth, ID band Patient awake    Reviewed: Allergy & Precautions, H&P , NPO status , Patient's Chart, lab work & pertinent test results, reviewed documented beta blocker date and time   Airway Mallampati: II  TM Distance: >3 FB Neck ROM: full    Dental no notable dental hx. (+) Teeth Intact, Dental Advisory Given   Pulmonary neg pulmonary ROS   Pulmonary exam normal breath sounds clear to auscultation       Cardiovascular negative cardio ROS Normal cardiovascular exam Rhythm:regular Rate:Normal     Neuro/Psych  Headaches negative neurological ROS  negative psych ROS   GI/Hepatic negative GI ROS, Neg liver ROS,,,  Endo/Other  negative endocrine ROS    Renal/GU Renal diseasenegative Renal ROS  negative genitourinary   Musculoskeletal   Abdominal   Peds  Hematology negative hematology ROS (+)   Anesthesia Other Findings   Reproductive/Obstetrics (+) Pregnancy                             Anesthesia Physical Anesthesia Plan  ASA: 2  Anesthesia Plan: Epidural   Post-op Pain Management: Minimal or no pain anticipated   Induction: Intravenous  PONV Risk Score and Plan: 2 and Ondansetron  Airway Management Planned: Natural Airway and Simple Face Mask  Additional Equipment: None  Intra-op Plan:   Post-operative Plan: Extubation in OR  Informed Consent: I have reviewed the patients History and Physical, chart, labs and discussed the procedure including the risks, benefits and alternatives for the proposed anesthesia with the patient or authorized representative who has indicated his/her understanding and acceptance.       Plan Discussed with: Anesthesiologist and CRNA  Anesthesia Plan Comments:        Anesthesia Quick Evaluation

## 2023-02-22 NOTE — Progress Notes (Signed)
Labor Progress Note AMMARA RAJ is a 28 y.o. G1P0 at [redacted]w[redacted]d presented for IOL 2/2 PD.   S: Doing well no acute concerns. Her doula is at bedside.   O:  BP (!) 107/54   Pulse 71   Temp 98.1 F (36.7 C) (Axillary)   Resp 15   Ht 5\' 6"  (1.676 m)   Wt 88.5 kg   LMP 04/15/2022 (Exact Date)   SpO2 99%   BMI 31.51 kg/m  EFM: 130bpm/ moderate/+accels, no decel  CVE: Dilation: 5.5 Effacement (%): 80 Station: 0 Presentation: Vertex Exam by:: Salvadore Dom   A&P: 28 y.o. G1P0 [redacted]w[redacted]d here for PDIOL.  #Labor: Grossly unchanged since AROM. Start pitocin 1x1. Consider the need for an IUPC.  #Pain: Epidural #FWB:Cat I  #GBS positive, PCN ppx  Rh neg -Rhogam work up [employer], DO 2:50 PM

## 2023-02-22 NOTE — Anesthesia Procedure Notes (Signed)
Epidural Patient location during procedure: OB Start time: 02/22/2023 11:54 AM End time: 02/22/2023 12:03 PM  Staffing Anesthesiologist: Bethena Midget, MD  Preanesthetic Checklist Completed: patient identified, IV checked, site marked, risks and benefits discussed, surgical consent, monitors and equipment checked, pre-op evaluation and timeout performed  Epidural Patient position: sitting Prep: DuraPrep and site prepped and draped Patient monitoring: continuous pulse ox and blood pressure Approach: midline Location: L3-L4 Injection technique: LOR air  Needle:  Needle type: Tuohy  Needle gauge: 17 G Needle length: 9 cm and 9 Needle insertion depth: 6 cm Catheter type: closed end flexible Catheter size: 19 Gauge Catheter at skin depth: 12 cm Test dose: negative  Assessment Events: blood not aspirated, no cerebrospinal fluid, injection not painful, no injection resistance, no paresthesia and negative IV test

## 2023-02-23 ENCOUNTER — Encounter (HOSPITAL_COMMUNITY): Payer: Self-pay | Admitting: Obstetrics and Gynecology

## 2023-02-23 LAB — CBC
HCT: 33.5 % — ABNORMAL LOW (ref 36.0–46.0)
Hemoglobin: 10.9 g/dL — ABNORMAL LOW (ref 12.0–15.0)
MCH: 27.6 pg (ref 26.0–34.0)
MCHC: 32.5 g/dL (ref 30.0–36.0)
MCV: 84.8 fL (ref 80.0–100.0)
Platelets: 161 10*3/uL (ref 150–400)
RBC: 3.95 MIL/uL (ref 3.87–5.11)
RDW: 14.8 % (ref 11.5–15.5)
WBC: 13 10*3/uL — ABNORMAL HIGH (ref 4.0–10.5)
nRBC: 0 % (ref 0.0–0.2)

## 2023-02-23 MED ORDER — ACETAMINOPHEN 325 MG PO TABS
650.0000 mg | ORAL_TABLET | ORAL | Status: DC | PRN
  Administered 2023-02-23: 650 mg via ORAL
  Filled 2023-02-23: qty 2

## 2023-02-23 MED ORDER — SIMETHICONE 80 MG PO CHEW: 80.0000 mg | CHEWABLE_TABLET | ORAL | Status: DC | PRN

## 2023-02-23 MED ORDER — SENNOSIDES-DOCUSATE SODIUM 8.6-50 MG PO TABS
2.0000 | ORAL_TABLET | Freq: Every day | ORAL | Status: DC
  Administered 2023-02-24: 2 via ORAL
  Filled 2023-02-23: qty 2

## 2023-02-23 MED ORDER — IBUPROFEN 600 MG PO TABS
600.0000 mg | ORAL_TABLET | Freq: Four times a day (QID) | ORAL | Status: DC
  Administered 2023-02-23 – 2023-02-24 (×6): 600 mg via ORAL
  Filled 2023-02-23 (×6): qty 1

## 2023-02-23 MED ORDER — WITCH HAZEL-GLYCERIN EX PADS
1.0000 | MEDICATED_PAD | CUTANEOUS | Status: DC | PRN
  Administered 2023-02-23: 1 via TOPICAL

## 2023-02-23 MED ORDER — ONDANSETRON HCL 4 MG/2ML IJ SOLN: 4.0000 mg | INTRAMUSCULAR | Status: DC | PRN

## 2023-02-23 MED ORDER — ONDANSETRON HCL 4 MG PO TABS: 4.0000 mg | ORAL_TABLET | ORAL | Status: DC | PRN

## 2023-02-23 MED ORDER — COCONUT OIL OIL: 1.0000 | TOPICAL_OIL | Status: DC | PRN

## 2023-02-23 MED ORDER — TETANUS-DIPHTH-ACELL PERTUSSIS 5-2.5-18.5 LF-MCG/0.5 IM SUSY
0.5000 mL | PREFILLED_SYRINGE | Freq: Once | INTRAMUSCULAR | Status: DC
Start: 2023-02-24 — End: 2023-02-24

## 2023-02-23 MED ORDER — RHO D IMMUNE GLOBULIN 1500 UNIT/2ML IJ SOSY
300.0000 ug | PREFILLED_SYRINGE | Freq: Once | INTRAMUSCULAR | Status: AC
  Administered 2023-02-23: 300 ug via INTRAVENOUS
  Filled 2023-02-23: qty 2

## 2023-02-23 MED ORDER — PRENATAL MULTIVITAMIN CH
1.0000 | ORAL_TABLET | Freq: Every day | ORAL | Status: DC
  Administered 2023-02-23 – 2023-02-24 (×2): 1 via ORAL
  Filled 2023-02-23 (×2): qty 1

## 2023-02-23 MED ORDER — DIBUCAINE (PERIANAL) 1 % EX OINT
1.0000 | TOPICAL_OINTMENT | CUTANEOUS | Status: DC | PRN
  Administered 2023-02-23: 1 via RECTAL
  Filled 2023-02-23: qty 28

## 2023-02-23 MED ORDER — DIPHENHYDRAMINE HCL 25 MG PO CAPS: 25.0000 mg | ORAL_CAPSULE | Freq: Four times a day (QID) | ORAL | Status: DC | PRN

## 2023-02-23 MED ORDER — BENZOCAINE-MENTHOL 20-0.5 % EX AERO
1.0000 | INHALATION_SPRAY | CUTANEOUS | Status: DC | PRN
  Administered 2023-02-23: 1 via TOPICAL
  Filled 2023-02-23: qty 56

## 2023-02-23 MED ORDER — ZOLPIDEM TARTRATE 5 MG PO TABS: 5.0000 mg | ORAL_TABLET | Freq: Every evening | ORAL | Status: DC | PRN

## 2023-02-23 NOTE — Progress Notes (Signed)
POSTPARTUM PROGRESS NOTE  Post Partum Day 0  Subjective:  Felicia Larsen is a 28 y.o. G1P1001 s/p VD at [redacted]w[redacted]d.  She reports she is doing well. No acute events overnight. She denies any problems with ambulating, voiding or po intake. Denies nausea or vomiting.  Pain is moderately controlled.  Lochia is appropriate.  Objective: Blood pressure 119/70, pulse 85, temperature 97.9 F (36.6 C), temperature source Oral, resp. rate 20, height 5\' 6"  (1.676 m), weight 88.5 kg, last menstrual period 04/15/2022, SpO2 100%, unknown if currently breastfeeding.  Physical Exam:  General: alert, cooperative and no distress Chest: no respiratory distress Heart:regular rate, distal pulses intact Abdomen: soft, nontender,  Uterine Fundus: firm, appropriately tender DVT Evaluation: No calf swelling or tenderness Extremities: No LE edema Skin: warm, dry  Recent Labs    02/21/23 2135 02/23/23 0416  HGB 12.0 10.9*  HCT 37.0 33.5*    Assessment/Plan: Felicia Larsen is a 28 y.o. G1P1001 s/p VD at [redacted]w[redacted]d 2/2 pdIOL.   PPD#0 - Doing well  Routine postpartum care Hgb appropriate Feeding: Breast/Bottle Dispo: Plan for discharge 1/28.   LOS: 2 days   Montay Vanvoorhis Autry-Lott, DO 02/23/2023, 9:03 AM

## 2023-02-23 NOTE — Anesthesia Postprocedure Evaluation (Signed)
Anesthesia Post Note  Patient: Felicia Larsen  Procedure(s) Performed: AN AD HOC LABOR EPIDURAL     Patient location during evaluation: Mother Baby Anesthesia Type: Epidural Level of consciousness: awake and alert Pain management: pain level controlled Vital Signs Assessment: post-procedure vital signs reviewed and stable Respiratory status: spontaneous breathing, nonlabored ventilation and respiratory function stable Cardiovascular status: stable Postop Assessment: no headache, no backache, epidural receding, no apparent nausea or vomiting, patient able to bend at knees, able to ambulate and adequate PO intake Anesthetic complications: no   No notable events documented.  Last Vitals:  Vitals:   02/23/23 0245 02/23/23 0345  BP: 132/69 119/70  Pulse: 92 85  Resp: 20 20  Temp: 36.7 C 36.6 C  SpO2: 99% 100%    Last Pain:  Vitals:   02/23/23 0730  TempSrc:   PainSc: 0-No pain   Pain Goal:                   [employer]

## 2023-02-23 NOTE — Progress Notes (Signed)
   02/23/23 0000  Spiritual Encounters  Type of Visit Initial  Care provided to: Pt and family  Conversation partners present during encounter Nurse  Referral source Code page  Reason for visit Code  OnCall Visit Yes    Chaplain was present for neonatal code blue to provide emotional and spiritual support to the family. The baby and mother appeared to be healthy. A brief conversation took place as the mother was engaged with the baby and herself. Chaplain will be available, if more assistance is needed.    M.Kubra Delano Metz Resident [phone]

## 2023-02-23 NOTE — Lactation Note (Signed)
This note was copied from a baby's chart. Lactation Consultation Note  Patient Name: Felicia Larsen ZOXWR'U Date: 02/23/2023 Age:26 hours Reason for consult: Initial assessment;Primapara;1st time breastfeeding;Term (Raynauds syndrome)  P1- MOB's feeding plan is both breast and formula. MOB reports to have nursed infant x2 since birth and the rest have been formula feedings. MOB reports that she hasn't placed infant as frequently to the breast because she is concerned about infant's arm and she is afraid to hurt her. MOB denies having any pain or pinching when infant latches. Infant had just eaten 12 mL of formula before LC and RN came into room. After the RN was done with the assessment, infant started rooting on her hand. RN encouraged attempting a latch.  LC assisted MOB with placing infant on the left breast in the cross cradle hold. MOB reports disliking to the football position. LC demonstrated how to sandwich the breast and entice infant into latching. Infant licked the breast a few times, but would not open her mouth to latch. After 10 minutes of attempting, LC concluded that infant was not Interested at this time. MOB then proceeded to cuddle infant and rock her to sleep.  LC reviewed feeding infant on cue 8-12x in 24 hrs, not allowing infant to go over 3 hrs without a feeding, CDC milk storage guidelines and LC services handout. LC encouraged MOB to call for further assistance as needed.  Maternal Data Has patient been taught Hand Expression?: No Does the patient have breastfeeding experience prior to this delivery?: No  Feeding Mother's Current Feeding Choice: Breast Milk and Formula  LATCH Score Latch: Too sleepy or reluctant, no latch achieved, no sucking elicited.  Audible Swallowing: None  Type of Nipple: Everted at rest and after stimulation  Comfort (Breast/Nipple): Soft / non-tender  Hold (Positioning): Assistance needed to correctly position infant at breast and  maintain latch.  LATCH Score: 5   Lactation Tools Discussed/Used Pump Education: Milk Storage  Interventions Interventions: Breast feeding basics reviewed;Assisted with latch;Breast compression;Adjust position;Support pillows;Position options;Education;LC Services brochure  Discharge Discharge Education: Warning signs for feeding baby Pump: DEBP;Personal  Consult Status Consult Status: Follow-up Date: 02/24/23 Follow-up type: In-patient    Dema Severin BS, IBCLC 02/23/2023, 4:21 PM

## 2023-02-24 ENCOUNTER — Ambulatory Visit (HOSPITAL_COMMUNITY): Payer: Medicaid Other | Admitting: Clinical

## 2023-02-24 LAB — RH IG WORKUP (INCLUDES ABO/RH)
Fetal Screen: NEGATIVE
Gestational Age(Wks): 41.1
Unit division: 0

## 2023-02-24 MED ORDER — IBUPROFEN 600 MG PO TABS: 600.0000 mg | ORAL_TABLET | Freq: Four times a day (QID) | ORAL | 1 refills | Status: AC | PRN

## 2023-02-24 NOTE — Discharge Summary (Signed)
Postpartum Discharge Summary  Date of Service updated 02-24-23     Patient Name: Felicia Larsen DOB: 1995-02-04 MRN: [id number]  Date of admission: 02/21/2023 Delivery date:02/23/2023 Delivering provider: Lavonda Jumbo Date of discharge: 02/24/2023  Admitting diagnosis: Normal labor and delivery [O80] Intrauterine pregnancy: 110w1d     Secondary diagnosis:  Principal Problem:   Normal labor and delivery  Additional problems: None    Discharge diagnosis: Term Pregnancy Delivered                                              Post partum procedures: n/a Augmentation: AROM and Pitocin Complications: None  Hospital course: Induction of Labor With Vaginal Delivery   28 y.o. yo G1P1001 at [redacted]w[redacted]d was admitted to the hospital 02/21/2023 for induction of labor.  Indication for induction: Postdates.  Patient had an labor course complicated by nothing Membrane Rupture Time/Date: 9:50 AM,02/22/2023  Delivery Method:Vaginal, Spontaneous Operative Delivery:N/A Episiotomy: None Lacerations:  None Details of delivery can be found in separate delivery note.  Patient had a postpartum course complicated by nothing. Patient is discharged home 02/24/23.  Newborn Data: Birth date:02/23/2023 Birth time:12:19 AM Gender:Female Living status:Living Apgars:6 ,9  Weight:3630 g  Magnesium Sulfate received: No  Immunizations administered: Immunization History  Administered Date(s) Administered   DTaP 07/26/1997, 09/20/1997, 01/16/1998, 10/23/1998, 08/06/2001   HIB (PRP-OMP) 07/26/1997, 09/20/1997, 06/08/1998, 10/23/1998   HPV Quadrivalent 09/02/2006, 09/10/2007, 03/03/2008   Hepatitis A 07/22/2005, 09/02/2006   Hepatitis B 1997/11/22, 06/21/1997, 01/16/1998   IPV 07/26/1997, 09/20/1997, 08/06/2001, 01/17/2008   MMR 06/08/1998, 08/06/2001   Meningococcal Conjugate 08/13/2011, 08/05/2013   Tdap 09/10/2007   Varicella 06/08/1998, 07/22/2005    Physical exam  Vitals:   02/23/23 1300  02/23/23 1709 02/23/23 2043 02/24/23 0557  BP: 126/78 125/76 133/77 128/74  Pulse: 68 67 79 (!) 58  Resp: 16 16 20 20   Temp: 97.8 F (36.6 C) 98 F (36.7 C) 97.8 F (36.6 C) 98.1 F (36.7 C)  TempSrc: Axillary Axillary Oral Oral  SpO2:  100% 100% 100%  Weight:      Height:       General: alert, cooperative, and no distress Lochia: appropriate Uterine Fundus: FF, NT Incision: N/A DVT Evaluation: No evidence of DVT seen on physical exam. Labs: Lab Results  Component Value Date   WBC 13.0 (H) 02/23/2023   HGB 10.9 (L) 02/23/2023   HCT 33.5 (L) 02/23/2023   MCV 84.8 02/23/2023   PLT 161 02/23/2023      Latest Ref Rng & Units 02/25/2016    1:50 PM  CMP  Glucose 65 - 99 mg/dL 90   BUN 6 - 20 mg/dL 8   Creatinine 8.65 - 7.84 mg/dL 6.96   Sodium 295 - 284 mmol/L 136   Potassium 3.5 - 5.1 mmol/L 3.2   Chloride 101 - 111 mmol/L 107   CO2 22 - 32 mmol/L 22   Calcium 8.9 - 10.3 mg/dL 9.0   Total Protein 6.5 - 8.1 g/dL 7.0   Total Bilirubin 0.3 - 1.2 mg/dL 0.6   Alkaline Phos 38 - 126 U/L 77   AST 15 - 41 U/L 19   ALT 14 - 54 U/L 13    Edinburgh Score:    02/23/2023   12:30 PM  Edinburgh Postnatal Depression Scale Screening Tool  I have been able to laugh and see  the funny side of things. 0  I have looked forward with enjoyment to things. 0  I have blamed myself unnecessarily when things went wrong. 0  I have been anxious or worried for no good reason. 0  I have felt scared or panicky for no good reason. 1  Things have been getting on top of me. 1  I have been so unhappy that I have had difficulty sleeping. 1  I have felt sad or miserable. 0  I have been so unhappy that I have been crying. 0  The thought of harming myself has occurred to me. 0  Edinburgh Postnatal Depression Scale Total 3      After visit meds:  Allergies as of 02/24/2023       Reactions   Latex Itching, Rash   IRRITATION        Medication List     STOP taking these medications     clotrimazole 1 % vaginal cream Commonly known as: GYNE-LOTRIMIN   Doxylamine-Pyridoxine 10-10 MG Tbec Commonly known as: Diclegis   prenatal multivitamin Tabs tablet   valACYclovir 500 MG tablet Commonly known as: VALTREX       TAKE these medications    ibuprofen 600 MG tablet Commonly known as: ADVIL Take 1 tablet (600 mg total) by mouth every 6 (six) hours as needed.         Discharge home in stable condition Infant Feeding:  both Infant Disposition:home with mother Discharge instruction: per After Visit Summary and Postpartum booklet. Activity: Advance as tolerated. Pelvic rest for 6 weeks.  Diet: routine diet Anticipated Birth Control: Unsure Postpartum Appointment: 03-27-23 @ 8:45am Additional Postpartum F/U:  none Future Appointments: Future Appointments  Date Time Provider Department Center  04/11/2023  9:00 AM Grossman-Orr, Carloyn Jaeger, LCSW [facility] None  04/25/2023 10:00 AM Grossman-Orr, Carloyn Jaeger, LCSW [facility] None  05/02/2023  9:00 AM Grossman-Orr, Carloyn Jaeger, LCSW [facility] None  05/16/2023  9:00 AM Grossman-Orr, Carloyn Jaeger, LCSW [facility] None  05/30/2023  9:00 AM Grossman-Orr, Carloyn Jaeger, LCSW [facility] None   Follow up Visit:  Follow-up Information     [facility] Follow up on 03/27/2023.   Specialty: Obstetrics and Gynecology Why: For Postpartum follow-up @ 8:45am Contact information: [address] [place] [address] [phone]                    02/24/2023 Purcell Nails, MD

## 2023-02-24 NOTE — Lactation Note (Signed)
This note was copied from a baby's chart. Lactation Consultation Note  Patient Name: Felicia Larsen ZOXWR'U Date: 02/24/2023 Age:26 hours Reason for consult: Follow-up assessment;Primapara;1st time breastfeeding;Term;Infant weight loss (mom ready for D/C) Per mom baby has latched x 2 since she has been born and fussy so I started bottles.  LC reviewed steps for latching , hand expressing and recommended if the baby is to fussy to latch, feed her a small appetizer of EBM or formula 10 ml and then latch. If she sustains the latch, a good average feeding per side is 15 -20 mins . If still hungry offer the 2nd breast.  Per mom may just pump and bottle feed and as a DEBP at home.  LC recommended to enhance milk volume to increase until milk comes in post pump whether baby latches or not and when baby is more consistent latching, ease off the post pumping and use the hand pump provided.  LC reviewed engorgement prevention and tx.  LC reviewed is mom decides to exclusively pump ( 8-10 times a day both breast for 15 -20 mins. And mom aware of the flange size being #21 F for today.  MOB and FOB, baby ready for D/C, and baby had recently fed per mom  Maternal Data Has patient been taught Hand Expression?: Yes Does the patient have breastfeeding experience prior to this delivery?: No  Feeding Mother's Current Feeding Choice: Breast Milk and Formula Nipple Type: Slow - flow  Lactation Tools Discussed/Used Tools: Flanges;Pump Flange Size: 21;24;27 Breast pump type: Manual Pump Education: Setup, frequency, and cleaning;Milk Storage  Interventions Interventions: Breast feeding basics reviewed;Hand pump;Education;LC Services brochure;CDC Guidelines for Breast Pump Cleaning  Discharge Discharge Education: Engorgement and breast care;Warning signs for feeding baby Pump: DEBP;Personal WIC Program: Yes  Consult Status Consult Status: Complete Date: 02/24/23    Kathrin Greathouse 02/24/2023, 12:21 PM

## 2023-02-27 ENCOUNTER — Ambulatory Visit: Payer: BC Managed Care – PPO | Admitting: Cardiology

## 2023-03-03 ENCOUNTER — Telehealth (HOSPITAL_COMMUNITY): Payer: Self-pay | Admitting: *Deleted

## 2023-03-03 NOTE — Telephone Encounter (Signed)
03/03/2023  Name: Felicia Larsen MRN: [id number] DOB: [birth year]  Reason for Call:  Transition of Care Hospital Discharge Call  Contact Status: Patient Contact Status: Complete  Language assistant needed: Interpreter Mode: Interpreter Not Needed        Follow-Up Questions: Do You Have Any Concerns About Your Health As You Heal From Delivery?: Yes What Concerns Do You Have About Your Health?: She is having some pain with urination.  She has already called OB office and has an appointment on Feb 5.  Encouraged patient to keep appointment.  She also notes having numbness in her fingers.  Advised patient to talk about this with OB on Feb 5.  In the meantime she could do some gentle stretches to open her chest and shoulder area (hands behind back, shoulder circles, arm circles, etc.) to counteract the posture of cradling a newborn in her arms. Do You Have Any Concerns About Your Infants Health?: No  Edinburgh Postnatal Depression Scale:  In the Past 7 Days: I have been able to laugh and see the funny side of things.: As much as I always could I have looked forward with enjoyment to things.: As much as I ever did I have blamed myself unnecessarily when things went wrong.: Not very often I have been anxious or worried for no good reason.: Yes, sometimes I have felt scared or panicky for no good reason.: No, not at all Things have been getting on top of me.: No, I have been coping as well as ever I have been so unhappy that I have had difficulty sleeping.: Not at all I have felt sad or miserable.: No, not at all I have been so unhappy that I have been crying.: No, never The thought of harming myself has occurred to me.: Never [name] Postnatal Depression Scale Total: 3  Patient notes frequent tearfulness, but not because she is unhappy as in the question above.  We discussed Baby Blues vs. PMADs and I encouraged patient to call OB if her symptoms get worse or if they persist beyond two  weeks postpartum. PHQ2-9 Depression Scale:     Discharge Follow-up: Edinburgh score requires follow up?: No Patient was advised of the following resources:: Support Group, Breastfeeding Support Group  Post-discharge interventions: Reviewed Newborn Safe Sleep Practices  Salena Saner, RN 03/03/2023 12:42

## 2023-03-08 ENCOUNTER — Telehealth: Payer: Self-pay | Admitting: Family Medicine

## 2023-03-08 NOTE — Telephone Encounter (Signed)
 Error

## 2023-04-11 ENCOUNTER — Encounter (HOSPITAL_COMMUNITY): Payer: Self-pay | Admitting: Clinical

## 2023-04-11 ENCOUNTER — Ambulatory Visit (INDEPENDENT_AMBULATORY_CARE_PROVIDER_SITE_OTHER): Payer: Medicaid Other | Admitting: Clinical

## 2023-04-11 DIAGNOSIS — F331 Major depressive disorder, recurrent, moderate: Secondary | ICD-10-CM | POA: Diagnosis not present

## 2023-04-11 DIAGNOSIS — F419 Anxiety disorder, unspecified: Secondary | ICD-10-CM | POA: Diagnosis not present

## 2023-04-11 NOTE — Progress Notes (Signed)
 THERAPIST PROGRESS NOTE  Session Time: 9:03am-9:59am  Session #4  Virtual Visit via Video Note  I connected with Felicia Larsen on 04/11/23 at  9:00 AM EDT by a video enabled telemedicine application and verified that I am speaking with the correct person using two identifiers.  Location: Patient: home Provider: [facility] outpatient therapy office - [place]   I discussed the limitations of evaluation and management by telemedicine and the availability of in person appointments. The patient expressed understanding and agreed to proceed.   I discussed the assessment and treatment plan with the patient. The patient was provided an opportunity to ask questions and all were answered. The patient agreed with the plan and demonstrated an understanding of the instructions.   The patient was advised to call back or seek an in-person evaluation if the symptoms worsen or if the condition fails to improve as anticipated.  I provided 56 minutes of non-face-to-face time during this encounter.  Lynnell Chad, LCSW    Participation Level: Active  Behavioral Response: Casual Alert Euthymic  Type of Therapy: Individual Therapy  Treatment Goals addressed:  Goal: LTG:  Score less than 5 on the GAD-7 as evidenced by intermittent administration of the questionnaire to determine progress in management of anxiety.    Goal: STG:  Learn about boundary types, how to implement them, and how to enforce them so that patient feels more empowered and content with being able to maintain more helpful, appropriate boundaries in the future for a more balanced result.    Goal: LTG: Learn and practice communication techniques such as "I" statements, open-ended questions, reflective listening, assertiveness, fair fighting rules, initiating conversations, and more as necessary and taught in session    Goal: STG: Learn about the feeling of anxiety and its many variations, the cycle of anxiety and how to  interrupt that cycle.  Goal: LTG: Implement daily relaxation and mindfulness practice  Learn breathing techniques and grounding techniques at an age-appropriate level and demonstrate mastery in session then report independent use of these skills out of session.  Goal: LTG: Score less than 9 on the Patient Health Questionnaire (PHQ-9) as evidenced by intermittent administration of the questionnaire to determine progress.   Goal: STG: Patient will process life events to the extent needed so that patient is able to move forward with various areas of life in a better frame of mind per self-report   Goal: LTG: Learn a variety of coping skills and demonstrate the ability to use them to decrease feelings of sadness, anger, and fear and increase feelings of happiness, peace, and powerfulness AEB gauging those emotions on 1-10 scale.    Goal: STG: Identify and decrease cognitive distortions contributing negatively to mood and behavior by identifying 5-7 cognitive distortions patient has and learning how to come up with replacement thoughts that are more balanced, realistic, and helpful.  ProgressTowards Goals: Progressing  Interventions: CBT, Psychosocial Skills: emphasis on "I" statements and fair fighting rules, and Supportive   Summary: Felicia Larsen is a 28 y.o. female who presents with depression, communication issues with father of her unborn child, anxiety, anger problems, a desire to be a good parent, being referred from her neurologist whom she sees for migraines.  She presented oriented x5 and stated she was feeling "good."  CSW evaluated patient's medication compliance, use of coping tools, and self-care, as applicable.  She provided an update on various aspects of her life that are normally discussed in therapy, including her baby and her romantic  relationship.  She has had her baby now, was induced at 41 weeks, and had the baby in her arms for most of the assessment.  Her baby's father has  returned to the home and since the baby was born, they are both focused on the baby so are not fighting with each other like they used to.  They are using [employer] as discussed in previous sessions.  He was in the room throughout the therapy session and he spoke up verbally at times to state how helpful those rules were, especially the "I" statements.  They both verbalized an agreement to continue to use those statements faithfully.  Patient shared that she will be returning to work on 4/20 and is also signed up for a 6-week temporary job due to needing extra money.  That will be a temporary job from home.  We processed the reasons she was very distrustful when the baby was first born, would not let anybody touch the baby and would not sleep for fear something would happen.  CSW was able to normalize this for her and ensure her it is not unusual.  CSW provided psychoeducation about the Cognitive Behavioral model and reviewed some of the cognitive distortions with her.  Various handouts were shared with her on-screen and then sent to her via email.  She voiced understanding and stated it was very helpful to have this new way of looking at things to think about.  Suicidal/Homicidal: No without intent/plan  Therapist Response: Patient is progressing AEB engaging in scheduled therapy session.  Throughout the session, CSW gave patient the opportunity to explore thoughts and feelings associated with current life situations and past/present stressors.   CSW challenged patient gently and appropriately to consider different ways of looking at reported issues. CSW encouraged patient's expression of feelings and validated these using empathy, active listening, open body language, and unconditional positive regard.    Recommendations:  Return to therapy in 2 weeks, continue to practice "I" statements and fair fighting rules with baby's father, use concepts of CBT as taught in session and handouts as sent in  email  Plan: Return again in 2 weeks on 3/28  Diagnosis:  Major depressive disorder, recurrent episode, moderate degree (HCC)  Anxiety disorder, unspecified type  Collaboration of Care: Other - refuses medication at this time, [facility] providers can see that patient is in therapy but likely cannot read the notes  Patient/Guardian was advised Release of Information must be obtained prior to any record release in order to collaborate their care with an outside provider. Patient/Guardian was advised if they have not already done so to contact the registration department to sign all necessary forms in order for [place] to release information regarding their care.   Consent: Patient/Guardian gives verbal consent for treatment and assignment of benefits for services provided during this visit. Patient/Guardian expressed understanding and agreed to proceed.   Lynnell Chad, LCSW 04/11/2023

## 2023-04-25 ENCOUNTER — Ambulatory Visit (HOSPITAL_COMMUNITY): Payer: Medicaid Other | Admitting: Clinical

## 2023-04-25 ENCOUNTER — Encounter (HOSPITAL_COMMUNITY): Payer: Self-pay | Admitting: Clinical

## 2023-04-25 DIAGNOSIS — F419 Anxiety disorder, unspecified: Secondary | ICD-10-CM

## 2023-04-25 DIAGNOSIS — F331 Major depressive disorder, recurrent, moderate: Secondary | ICD-10-CM | POA: Diagnosis not present

## 2023-04-25 NOTE — Progress Notes (Signed)
 THERAPIST PROGRESS NOTE  Session Time: 10:04am-10:59am  Session #5  Virtual Visit via Video Note  I connected with Felicia Larsen on 04/25/23 at 10:00 AM EDT by a video enabled telemedicine application and verified that I am speaking with the correct person using two identifiers.  Location: Patient: home Provider: [facility] outpatient therapy office - [place]   I discussed the limitations of evaluation and management by telemedicine and the availability of in person appointments. The patient expressed understanding and agreed to proceed.   I discussed the assessment and treatment plan with the patient. The patient was provided an opportunity to ask questions and all were answered. The patient agreed with the plan and demonstrated an understanding of the instructions.   The patient was advised to call back or seek an in-person evaluation if the symptoms worsen or if the condition fails to improve as anticipated.  I provided 55 minutes of non-face-to-face time during this encounter.  Lynnell Chad, LCSW    Participation Level: Active  Behavioral Response: Casual Alert Euthymic  Type of Therapy: Individual Therapy  Treatment Goals addressed:  Goal: LTG:  Score less than 5 on the GAD-7 as evidenced by intermittent administration of the questionnaire to determine progress in management of anxiety.    Goal: STG:  Learn about boundary types, how to implement them, and how to enforce them so that patient feels more empowered and content with being able to maintain more helpful, appropriate boundaries in the future for a more balanced result.    Goal: LTG: Learn and practice communication techniques such as "I" statements, open-ended questions, reflective listening, assertiveness, fair fighting rules, initiating conversations, and more as necessary and taught in session    Goal: STG: Learn about the feeling of anxiety and its many variations, the cycle of anxiety and how to  interrupt that cycle.  Goal: LTG: Implement daily relaxation and mindfulness practice  Learn breathing techniques and grounding techniques at an age-appropriate level and demonstrate mastery in session then report independent use of these skills out of session.  Goal: LTG: Score less than 9 on the Patient Health Questionnaire (PHQ-9) as evidenced by intermittent administration of the questionnaire to determine progress.   Goal: STG: Patient will process life events to the extent needed so that patient is able to move forward with various areas of life in a better frame of mind per self-report   Goal: LTG: Learn a variety of coping skills and demonstrate the ability to use them to decrease feelings of sadness, anger, and fear and increase feelings of happiness, peace, and powerfulness AEB gauging those emotions on 1-10 scale.    Goal: STG: Identify and decrease cognitive distortions contributing negatively to mood and behavior by identifying 5-7 cognitive distortions patient has and learning how to come up with replacement thoughts that are more balanced, realistic, and helpful.  ProgressTowards Goals: Progressing  Interventions: CBT, Supportive, and Meditation: 5-finger breathing and ground skill 5-4-3-2-1    Summary: Felicia Larsen is a 28 y.o. female who presents with depression, communication issues with father of her unborn child, anxiety, anger problems, a desire to be a good parent, being referred from her neurologist whom she sees for migraines.  She presented oriented x5 and stated she was feeling "good."  CSW evaluated patient's medication compliance, use of coping tools, and self-care, as applicable.  She provided an update on various aspects of her life that are normally discussed in therapy, including her baby, upcoming work, and relationship with baby's  father.   She feels she is "getting the hang of mothering" and is more comfortable, is more willing to let others such as her boyfriend  step in and help.  In fact they have figured out their situation for childcare, he is going to be a stay-at-home dad for now.  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  She is looking for a different job, though, feeling that she should be able to work from home 5 days a week and not just 2.  She expressed some negative feelings about people from work showing up for her baby shower then not being in touch with her during her maternity leave, and CSW took her through the CBT manner of looking at her feelings and thoughts.  She feels she is being less controlling, but still recognizes that she becomes very anxious when she cannot control something.  CSW introduced her to the [employer], 5-finger breathing, and 5-4-3-2-1 grounding.  We practiced these together and explored when/how they can be helpful to her.  She was very encouraged by these things.  She stated she wants to focus on the positive because she notices that when she focuses on something negative, it makes it worse.  CSW taught her the saying, "Whatever you focus on, grows."  CSW emailed her a copy of the serenity prayer, 5-finger breathing exercise, and 5-4-3-2-1 exercuse.  Suicidal/Homicidal: No without intent/plan  Therapist Response: Patient is progressing AEB engaging in scheduled therapy session.  Throughout the session, CSW gave patient the opportunity to explore thoughts and feelings associated with current life situations and past/present stressors.   CSW challenged patient gently and appropriately to consider different ways of looking at reported issues. CSW encouraged patient's expression of feelings and validated these using empathy, active listening, open body language, and unconditional positive regard.    Recommendations:  Return to therapy in 1 week, continue to examine cognitive distortions, practice 5-finger breathing and 5-4-3-2-1 grounding, say serenity prayer to  deal with the things she controls versus the things she does not control  Plan: Return again in 2 weeks on 4/4  Diagnosis:  Major depressive disorder, recurrent episode, moderate degree (HCC)  Anxiety disorder, unspecified type  Collaboration of Care: Other - refuses medication at this time, [facility] providers can see that patient is in therapy but likely cannot read the notes  Patient/Guardian was advised Release of Information must be obtained prior to any record release in order to collaborate their care with an outside provider. Patient/Guardian was advised if they have not already done so to contact the registration department to sign all necessary forms in order for [place] to release information regarding their care.   Consent: Patient/Guardian gives verbal consent for treatment and assignment of benefits for services provided during this visit. Patient/Guardian expressed understanding and agreed to proceed.   Lynnell Chad, LCSW 04/25/2023

## 2023-05-02 ENCOUNTER — Ambulatory Visit (INDEPENDENT_AMBULATORY_CARE_PROVIDER_SITE_OTHER): Payer: Medicaid Other | Admitting: Clinical

## 2023-05-02 DIAGNOSIS — Z91199 Patient's noncompliance with other medical treatment and regimen due to unspecified reason: Secondary | ICD-10-CM

## 2023-05-02 NOTE — Progress Notes (Signed)
 Felicia Larsen    CSW attempted to connect with patient for scheduled appointment via MyChart video text request x 2 and email request with no response; also attempted to connect via phone without success. CSW left message for patient to call office to reschedule therapy appointment.        Attempt 1: Text and email: 9:04am      Attempt 2: Text: 9:09       Attempt 3: Phone call and HIPAA-compliant voicemail: 9:15am      Left video chat open until:  9:16am      Per  policy, after multiple attempts to reach patient unsuccessfully at appointed time, visit will be coded as a no show.    Encounter Diagnosis  Name Primary?   No-show for appointment Yes       Ambrose Mantle, LCSW 05/02/2023, 9:15 AM

## 2023-05-16 ENCOUNTER — Ambulatory Visit (INDEPENDENT_AMBULATORY_CARE_PROVIDER_SITE_OTHER): Payer: Medicaid Other | Admitting: Clinical

## 2023-05-16 DIAGNOSIS — Z91198 Patient's noncompliance with other medical treatment and regimen for other reason: Secondary | ICD-10-CM

## 2023-05-16 NOTE — Progress Notes (Signed)
 Felicia Larsen    CSW attempted to connect with patient for scheduled appointment via MyChart video text request x 2 and email request with no response.  CSW called her and when she answered, she reported she had forgotten the appointment, was out at a store and could not talk, would just wait until next appointment on 5/2.      Attempt 1: Text and email: 9:01am      Attempt 2: Text: 9:12am   Phone call:  9:15am      Left video chat open until:  9:15am      Per [facility] policy, after multiple attempts to reach patient unsuccessfully at appointed time, visit will be coded as a no show.    Encounter Diagnosis  Name Primary?   Failure to attend appointment with reason given Yes        Leotha Rang, LCSW 05/16/2023, 9:16 AM

## 2023-05-30 ENCOUNTER — Ambulatory Visit (INDEPENDENT_AMBULATORY_CARE_PROVIDER_SITE_OTHER): Payer: Medicaid Other | Admitting: Clinical

## 2023-05-30 ENCOUNTER — Encounter (HOSPITAL_COMMUNITY): Payer: Self-pay | Admitting: Clinical

## 2023-05-30 DIAGNOSIS — F331 Major depressive disorder, recurrent, moderate: Secondary | ICD-10-CM | POA: Diagnosis not present

## 2023-05-30 DIAGNOSIS — F419 Anxiety disorder, unspecified: Secondary | ICD-10-CM | POA: Diagnosis not present

## 2023-05-30 NOTE — Progress Notes (Signed)
 THERAPIST PROGRESS NOTE  Session Time: 9:16-9:48am  Session #6  Virtual Visit via Video Note  I connected with Felicia Larsen on 05/30/23 at  9:00 AM EDT by a video enabled telemedicine application and verified that I am speaking with the correct person using two identifiers.  Location: Patient: home Provider: [facility] outpatient therapy office - [place]   I discussed the limitations of evaluation and management by telemedicine and the availability of in person appointments. The patient expressed understanding and agreed to proceed.   I discussed the assessment and treatment plan with the patient. The patient was provided an opportunity to ask questions and all were answered. The patient agreed with the plan and demonstrated an understanding of the instructions.   The patient was advised to call back or seek an in-person evaluation if the symptoms worsen or if the condition fails to improve as anticipated.  I provided 32 minutes of non-face-to-face time during this encounter.  Ancel Kass, LCSW    Participation Level: Active  Behavioral Response: Casual Drowsy and Lethargic Irritable  Type of Therapy: Individual Therapy  Treatment Goals addressed:  New treatment goals established, current goals reviewed:  STG: Patient will process life events to the extent needed so that patient is able to move forward with various areas of life in a better frame of mind per self-report  LTG:  Score less than 5 on the GAD-7 as evidenced by intermittent administration of the questionnaire to determine progress in management of anxiety.    LTG: Score less than 9 on the Patient Health Questionnaire (PHQ-9) as evidenced by intermittent administration of the questionnaire to determine progress. (OP Depression) LTG: Learn a variety of coping skills and demonstrate the ability to use them to decrease feelings of sadness, anger, and fear and increase feelings of happiness, peace, and  powerfulness AEB gauging those emotions on 1-10 scale.   STG: Identify and decrease cognitive distortions contributing negatively to mood and behavior by identifying 5-7 cognitive distortions patient has and learning how to come up with replacement thoughts that are more balanced, realistic, and helpful.  STG:  Learn about boundary types, how to implement them, and how to enforce them so that patient feels more empowered and content with being able to maintain more helpful, appropriate boundaries in the future for a more balanced result. LTG: Learn and practice communication techniques such as "I" statements, open-ended questions, reflective listening, assertiveness, fair fighting rules, initiating conversations, and more as necessary and taught in session    LTG: Implement daily relaxation and mindfulness practice  Learn breathing techniques and grounding techniques at an age-appropriate level and demonstrate mastery in session then report independent use of these skills out of session.    LTG: Learn about the emotion of anger and its variations, the cycle of anger and how to interrupt that cycle, also about the anger iceberg and other causes of this emotion that lets us  know something needs to change.    STG: Learn emotion regulation strategies, distress tolerance skills, interpersonal effectiveness techniques, and mindfulness practices and use them in session and in life situations to improve results and satisfaction.   ProgressTowards Goals: Progressing  Interventions: Psychosocial Skills: fair fighting rules and Supportive   Summary: Felicia Larsen is a 28 y.o. female who presents with depression, communication issues with father of her unborn child, anxiety, anger problems, a desire to be a good parent, being referred from her neurologist whom she sees for migraines.  She presented oriented x5 and  stated she was feeling "overwhelmed with working 2 jobs."  CSW evaluated patient's medication  compliance, use of coping tools, and self-care, as applicable.  She provided an update on various aspects of her life that are normally discussed in therapy, including the stress of working 2 jobs and her current relationship with baby's father.     Suicidal/Homicidal: No without intent/plan  Therapist Response:  Patient is progressing AEB engaging in scheduled therapy session.  Throughout the session, CSW gave patient the opportunity to explore thoughts and feelings associated with current life situations and past/present stressors.   CSW challenged patient gently and appropriately to consider different ways of looking at reported issues. CSW encouraged patient's expression of feelings and validated these using empathy, active listening, open body language, and unconditional positive regard.   CSW encouraged patient to schedule more therapy sessions for the future, as needed.   Plan/Recommendations:  Return to next scheduled appointment on 6/12, consider what boundaries she needs to set with herself  Diagnosis:  Major depressive disorder, recurrent episode, moderate degree (HCC)  Anxiety disorder, unspecified type  Collaboration of Care: Other - refuses medication at this time, [facility] providers can see that patient is in therapy but likely cannot read the notes  Patient/Guardian was advised Release of Information must be obtained prior to any record release in order to collaborate their care with an outside provider. Patient/Guardian was advised if they have not already done so to contact the registration department to sign all necessary forms in order for us  to release information regarding their care.   Consent: Patient/Guardian gives verbal consent for treatment and assignment of benefits for services provided during this visit. Patient/Guardian expressed understanding and agreed to proceed.   Ancel Kass, LCSW 05/30/2023

## 2023-07-10 ENCOUNTER — Encounter (HOSPITAL_COMMUNITY): Payer: Self-pay | Admitting: Clinical

## 2023-07-10 ENCOUNTER — Ambulatory Visit (HOSPITAL_COMMUNITY): Admitting: Clinical

## 2023-07-10 DIAGNOSIS — F331 Major depressive disorder, recurrent, moderate: Secondary | ICD-10-CM

## 2023-07-10 DIAGNOSIS — F419 Anxiety disorder, unspecified: Secondary | ICD-10-CM | POA: Diagnosis not present

## 2023-07-10 NOTE — Progress Notes (Signed)
 THERAPIST PROGRESS NOTE  Session Time: 2:00pm-2:29pm  Session #7  Virtual Visit via Video Note  I connected with Houston Mace on 07/10/23 at  2:00 PM EDT by a video enabled telemedicine application and verified that I am speaking with the correct person using two identifiers.  Location: Patient: home Provider: home office   I discussed the limitations of evaluation and management by telemedicine and the availability of in person appointments. The patient expressed understanding and agreed to proceed.   I discussed the assessment and treatment plan with the patient. The patient was provided an opportunity to ask questions and all were answered. The patient agreed with the plan and demonstrated an understanding of the instructions.   The patient was advised to call back or seek an in-person evaluation if the symptoms worsen or if the condition fails to improve as anticipated.  I provided 29 minutes of non-face-to-face time during this encounter.  Ancel Kass, LCSW    Participation Level: Active  Behavioral Response: Casual Alert Euthymic and Irritable  Type of Therapy: Individual Therapy  Treatment Goals addressed:  STG: Patient will process life events to the extent needed so that patient is able to move forward with various areas of life in a better frame of mind per self-report  LTG:  Score less than 5 on the GAD-7 as evidenced by intermittent administration of the questionnaire to determine progress in management of anxiety.    LTG: Score less than 9 on the Patient Health Questionnaire (PHQ-9) as evidenced by intermittent administration of the questionnaire to determine progress. (OP Depression) LTG: Learn a variety of coping skills and demonstrate the ability to use them to decrease feelings of sadness, anger, and fear and increase feelings of happiness, peace, and powerfulness AEB gauging those emotions on 1-10 scale.   STG: Identify and decrease cognitive  distortions contributing negatively to mood and behavior by identifying 5-7 cognitive distortions patient has and learning how to come up with replacement thoughts that are more balanced, realistic, and helpful.  STG:  Learn about boundary types, how to implement them, and how to enforce them so that patient feels more empowered and content with being able to maintain more helpful, appropriate boundaries in the future for a more balanced result. LTG: Learn and practice communication techniques such as I statements, open-ended questions, reflective listening, assertiveness, fair fighting rules, initiating conversations, and more as necessary and taught in session    LTG: Implement daily relaxation and mindfulness practice  Learn breathing techniques and grounding techniques at an age-appropriate level and demonstrate mastery in session then report independent use of these skills out of session.    LTG: Learn about the emotion of anger and its variations, the cycle of anger and how to interrupt that cycle, also about the anger iceberg and other causes of this emotion that lets us  know something needs to change.    STG: Learn emotion regulation strategies, distress tolerance skills, interpersonal effectiveness techniques, and mindfulness practices and use them in session and in life situations to improve results and satisfaction.   ProgressTowards Goals: Progressing  Interventions: Supportive and Meditation: self-care   Summary: MARIVEL MCCLARTY is a 28 y.o. female who presents with depression, communication issues with father of her unborn child, anxiety, anger problems, a desire to be a good parent, being referred from her neurologist whom she sees for migraines.  She presented oriented x5 and stated she was feeling pretty good.  CSW evaluated patient's medication compliance, use of coping  tools, and self-care, as applicable.  She provided an update on various aspects of her life that are normally  discussed in therapy, including her baby, job, and the relationship with baby's father.  She stated her 2nd job is finished and she misses it, especially the income.  She has been having to take time off work due to the baby being hospitalized several days for lack of weight gain, talked about needing FMLA.  CSW provided education about how to go about getting paperwork from the baby's doctor for intermittent FMLA.  She and the father have decided to not be together romantically, but to focus on themselves individually and on their daughter.  He is therefore not living there but is often there to watch the baby.  Patient has been focused on doing more things for herself in order to remain in the moment.  We talked about this and explored a variety of additional options throughout the remainder of the session.  CSW provided education about some guided meditation options.  Suicidal/Homicidal: No without intent/plan  Therapist Response:  Patient is progressing AEB engaging in scheduled therapy session.  Throughout the session, CSW gave patient the opportunity to explore thoughts and feelings associated with current life situations and past/present stressors.   CSW challenged patient gently and appropriately to consider different ways of looking at reported issues. CSW encouraged patient's expression of feelings and validated these using empathy, active listening, open body language, and unconditional positive regard.     Plan/Recommendations:  Return to next scheduled appointment on 7/10, do guided meditations, stretch more, work on keeping her word to herself, her baby, and others as she wishes to do  Diagnosis:  Major depressive disorder, recurrent episode, moderate degree (HCC)  Anxiety disorder, unspecified type  Collaboration of Care: Other - refuses medication at this time, [facility] providers can see that patient is in therapy but likely cannot read the notes  Patient/Guardian was advised Release of  Information must be obtained prior to any record release in order to collaborate their care with an outside provider. Patient/Guardian was advised if they have not already done so to contact the registration department to sign all necessary forms in order for us  to release information regarding their care.   Consent: Patient/Guardian gives verbal consent for treatment and assignment of benefits for services provided during this visit. Patient/Guardian expressed understanding and agreed to proceed.   Ancel Kass, LCSW 07/10/2023

## 2023-08-07 ENCOUNTER — Encounter (HOSPITAL_COMMUNITY): Payer: Self-pay

## 2023-08-07 ENCOUNTER — Ambulatory Visit (HOSPITAL_COMMUNITY): Admitting: Clinical

## 2023-08-07 DIAGNOSIS — Z91199 Patient's noncompliance with other medical treatment and regimen due to unspecified reason: Secondary | ICD-10-CM

## 2023-08-07 NOTE — Progress Notes (Signed)
 Felicia Larsen    CSW attempted to connect with patient for scheduled appointment via MyChart video text request x 2 and email request with no response; also attempted to connect via phone without success. CSW left message for patient stating that this is her 3rd no-show and if she misses the next appointment, she will be dismissed from my care.  The information about date and time of next appointment were left on the message.      Attempt 1: Text and email: 3:02pm       Attempt 2: Text: 3:07pm       Attempt 3: Phone call and HIPAA-compliant voicemail: 3:15pm      Left video chat open until:  3:15pm      Per [facility] policy, after multiple attempts to reach patient unsuccessfully at appointed time, visit will be coded as a no show.    Encounter Diagnosis  Name Primary?   No-show for appointment Yes       Elgie Crest, LCSW 08/07/2023, 3:19 PM

## 2023-08-22 ENCOUNTER — Ambulatory Visit (HOSPITAL_COMMUNITY): Admitting: Clinical

## 2023-08-22 ENCOUNTER — Encounter (HOSPITAL_COMMUNITY): Payer: Self-pay | Admitting: Clinical

## 2023-08-22 DIAGNOSIS — F419 Anxiety disorder, unspecified: Secondary | ICD-10-CM

## 2023-08-22 DIAGNOSIS — F331 Major depressive disorder, recurrent, moderate: Secondary | ICD-10-CM

## 2023-08-22 NOTE — Progress Notes (Signed)
 THERAPIST PROGRESS NOTE  Session Time: 11:00Am-11:56am  Session #8  Virtual Visit via Video Note  I connected with Felicia Larsen on 08/22/23 at 11:00 AM EDT by a video enabled telemedicine application and verified that I am speaking with the correct person using two identifiers.  Location: Patient: home Provider: home office   I discussed the limitations of evaluation and management by telemedicine and the availability of in person appointments. The patient expressed understanding and agreed to proceed.   I discussed the assessment and treatment plan with the patient. The patient was provided an opportunity to ask questions and all were answered. The patient agreed with the plan and demonstrated an understanding of the instructions.   The patient was advised to call back or seek an in-person evaluation if the symptoms worsen or if the condition fails to improve as anticipated.  I provided 56 minutes of non-face-to-face time during this encounter.  Elgie JINNY Crest, LCSW    Participation Level: Active  Behavioral Response: Casual Alert Euthymic  Type of Therapy: Individual Therapy  Treatment Goals addressed:  STG: Patient will process life events to the extent needed so that patient is able to move forward with various areas of life in a better frame of mind per self-report  LTG:  Score less than 5 on the GAD-7 as evidenced by intermittent administration of the questionnaire to determine progress in management of anxiety.    LTG: Score less than 9 on the Patient Health Questionnaire (PHQ-9) as evidenced by intermittent administration of the questionnaire to determine progress. (OP Depression) LTG: Learn a variety of coping skills and demonstrate the ability to use them to decrease feelings of sadness, anger, and fear and increase feelings of happiness, peace, and powerfulness AEB gauging those emotions on 1-10 scale.   STG: Identify and decrease cognitive distortions  contributing negatively to mood and behavior by identifying 5-7 cognitive distortions patient has and learning how to come up with replacement thoughts that are more balanced, realistic, and helpful.  STG:  Learn about boundary types, how to implement them, and how to enforce them so that patient feels more empowered and content with being able to maintain more helpful, appropriate boundaries in the future for a more balanced result. LTG: Learn and practice communication techniques such as I statements, open-ended questions, reflective listening, assertiveness, fair fighting rules, initiating conversations, and more as necessary and taught in session    LTG: Implement daily relaxation and mindfulness practice  Learn breathing techniques and grounding techniques at an age-appropriate level and demonstrate mastery in session then report independent use of these skills out of session.    LTG: Learn about the emotion of anger and its variations, the cycle of anger and how to interrupt that cycle, also about the anger iceberg and other causes of this emotion that lets us  know something needs to change.    STG: Learn emotion regulation strategies, distress tolerance skills, interpersonal effectiveness techniques, and mindfulness practices and use them in session and in life situations to improve results and satisfaction.   ProgressTowards Goals: Progressing  Interventions: CBT and Supportive   Summary: Felicia Larsen is a 28 y.o. female who presents with depression, communication issues with father of her unborn child, anxiety, anger problems, a desire to be a good parent, being referred from her neurologist whom she sees for migraines.  She presented oriented x5 and stated she was feeling all over the place.   She shared that she has been spending a lot  of time in the bed and depressed, not wanting to do anything. CSW evaluated patient's medication compliance, use of coping tools, and self-care, as  applicable.  She provided an update on various aspects of her life that are normally discussed in therapy, including her baby, the baby's father, her living situation, and stresses in her life.  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  She complained that he refuses to provide her with monetary support because the baby is so little and doesn't need anything and because he does some things like buying food.  We processed an event, particularly her reaction, when the baby came home with a black eye earlier in the week that he could not explain.  She self-analyzed that she talks to much to too many people about her problems and complains, was able to self-identify that this actually makes her problems worse because she is focusing on the negative, on the problem instead of seeking solutions.  She specifically processed how she wants to keep working to provide for herself and the baby and does not want to end up having to quit her job or rely on government assistance, was reminded that this type of assistance is no longer assured.  She shared that she has had a falling out with her sister and with her best friend, was able to see her part in the issues as we processed them.  Her friend confronted her about only being invested in the friendship when the baby's father is not actively in her life.  She stated, Sometimes I just don't want to be bothered.  CSW provided suggestions for her to consider about what true friendship is about, and how we often do for people we love even when we don't feel like it, with the end result often being that we feel much better.  She came up with decisions about what to do in each relationship to try to make things better not just over the current situation but in the long run.  CSW provided the background on CBT which has been done previously as well, but she heard as though for  the first time.  The triangle between feelings and thoughts and actions was explained, as well as how changing either thoughts or actions can change the way we feel.  A lot of time was spent on this and she did embrace the concepts, verbalized understanding and came up with her own examples.  CSW noted to her that one cognitive distortion she frequently is heard using is all-or-nothing thinking, and gave an example of how to interrupt herself hen she hears this type of statement coming out of her mouth.  We also spent time talking about the difference that gratitude makes in people's lives and happiness level.  Suicidal/Homicidal: No without intent/plan  Therapist Response:  Patient is progressing AEB engaging in scheduled therapy session.  Throughout the session, CSW gave patient the opportunity to explore thoughts and feelings associated with current life situations and past/present stressors.   CSW challenged patient gently and appropriately to consider different ways of looking at reported issues. CSW encouraged patient's expression of feelings and validated these using empathy, active listening, open body language, and unconditional positive regard.   She is very open to learning, said, This is what therapy is for.  We made another appointment.  Plan/Recommendations:  Return to next scheduled appointment  on 8/8, try to identify when she use all-or-nothing thinking in the next few weeks, try to focus on gratitude rather than anger, work to catch herself in conversation with others when she is complaining and stop herself in the moment  Diagnosis:  Major depressive disorder, recurrent episode, moderate degree (HCC)  Anxiety disorder, unspecified type  Collaboration of Care: Other - refuses medication at this time, [facility] providers can see that patient is in therapy but likely cannot read the notes  Patient/Guardian was advised Release of Information must be obtained prior to any record release in  order to collaborate their care with an outside provider. Patient/Guardian was advised if they have not already done so to contact the registration department to sign all necessary forms in order for us  to release information regarding their care.   Consent: Patient/Guardian gives verbal consent for treatment and assignment of benefits for services provided during this visit. Patient/Guardian expressed understanding and agreed to proceed.   Elgie JINNY Crest, LCSW 08/22/2023

## 2023-09-05 ENCOUNTER — Ambulatory Visit (HOSPITAL_COMMUNITY): Admitting: Clinical

## 2023-09-05 ENCOUNTER — Encounter (HOSPITAL_COMMUNITY): Payer: Self-pay

## 2023-09-05 DIAGNOSIS — Z91199 Patient's noncompliance with other medical treatment and regimen due to unspecified reason: Secondary | ICD-10-CM

## 2023-09-05 NOTE — Progress Notes (Signed)
 Lilyan LOISE Croft    CSW attempted to connect with patient for scheduled appointment via MyChart video text request x 2 and email request with no response.      Attempt 1: Text and email: 11:04am      Attempt 2: Text: 11:11am       Left video chat open until:  11:17am      Per [facility] policy, after multiple attempts to reach patient unsuccessfully at appointed time, visit will be coded as a no show.    Encounter Diagnosis  Name Primary?   No-show for appointment Yes       Elgie Crest, LCSW 09/05/2023, 11:18 AM

## 2023-09-26 ENCOUNTER — Encounter (HOSPITAL_COMMUNITY): Payer: Self-pay

## 2023-09-26 ENCOUNTER — Ambulatory Visit (INDEPENDENT_AMBULATORY_CARE_PROVIDER_SITE_OTHER): Payer: Self-pay | Admitting: Clinical

## 2023-09-26 DIAGNOSIS — Z91199 Patient's noncompliance with other medical treatment and regimen due to unspecified reason: Secondary | ICD-10-CM

## 2023-09-26 NOTE — Progress Notes (Signed)
 Felicia Larsen    CSW attempted to connect with patient for scheduled appointment via MyChart video text request x 2 and email request with no response.  This is her 4th no show in 4 months.  She has no more appointments scheduled at this point.      Attempt 1: Text and email: 11:04am      Attempt 2: Text: 11:10am       Left video chat open until:  11:20am      Per [facility] policy, after multiple attempts to reach patient unsuccessfully at appointed time, visit will be coded as a no show.    Encounter Diagnosis  Name Primary?   No-show for appointment Yes       Elgie Crest, LCSW 09/26/2023, 11:26 AM

## 2023-09-30 ENCOUNTER — Encounter (HOSPITAL_COMMUNITY): Payer: Self-pay | Admitting: Clinical

## 2023-09-30 ENCOUNTER — Telehealth (HOSPITAL_COMMUNITY): Payer: Self-pay | Admitting: Clinical

## 2023-09-30 NOTE — Telephone Encounter (Signed)
 Per Provider, patient has been discharged due to multiple missed appointments. Dismissal letter sent via [place] and certified mail on 09/30/2023.

## 2024-02-11 ENCOUNTER — Emergency Department (HOSPITAL_COMMUNITY)

## 2024-02-11 ENCOUNTER — Emergency Department (HOSPITAL_COMMUNITY)
Admission: EM | Admit: 2024-02-11 | Discharge: 2024-02-11 | Disposition: A | Discharge status: Home / Self Care | Attending: Emergency Medicine | Admitting: Emergency Medicine

## 2024-02-11 DIAGNOSIS — S0501XA Injury of conjunctiva and corneal abrasion without foreign body, right eye, initial encounter: Secondary | ICD-10-CM | POA: Diagnosis not present

## 2024-02-11 DIAGNOSIS — Y92009 Unspecified place in unspecified non-institutional (private) residence as the place of occurrence of the external cause: Secondary | ICD-10-CM | POA: Diagnosis not present

## 2024-02-11 DIAGNOSIS — Z91040 Latex allergy status: Secondary | ICD-10-CM | POA: Diagnosis not present

## 2024-02-11 DIAGNOSIS — S0591XA Unspecified injury of right eye and orbit, initial encounter: Secondary | ICD-10-CM | POA: Diagnosis present

## 2024-02-11 MED ORDER — NAPROXEN 500 MG PO TABS: 500.0000 mg | ORAL_TABLET | Freq: Two times a day (BID) | ORAL | 0 refills | Status: AC

## 2024-02-11 MED ORDER — ERYTHROMYCIN 5 MG/GM OP OINT: TOPICAL_OINTMENT | OPHTHALMIC | 0 refills | Status: AC

## 2024-02-11 MED ORDER — KETOROLAC TROMETHAMINE 30 MG/ML IJ SOLN
30.0000 mg | Freq: Once | INTRAMUSCULAR | Status: AC
  Administered 2024-02-11: 30 mg via INTRAMUSCULAR
  Filled 2024-02-11: qty 1

## 2024-02-11 MED ORDER — TETRACAINE HCL 0.5 % OP SOLN
2.0000 [drp] | Freq: Once | OPHTHALMIC | Status: DC
  Filled 2024-02-11: qty 4

## 2024-02-11 MED ORDER — FLUORESCEIN SODIUM 1 MG OP STRP
1.0000 | ORAL_STRIP | Freq: Once | OPHTHALMIC | Status: AC
  Administered 2024-02-11: 1 via OPHTHALMIC
  Filled 2024-02-11: qty 1

## 2024-02-11 NOTE — Discharge Instructions (Addendum)
 You are seen in the emergency department today for concerns of an eye injury.  Your imaging and exam were reassuring but there does appear to be a small abrasion to the cornea of the right eye.  I started on antibiotic ointment to take to try to help manage this.  For concerns of worsening symptoms, return to the emergency department.  Otherwise, please follow-up closely with your primary care provider.
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
